# Patient Record
Sex: Female | Born: 1990 | Race: Black or African American | Hispanic: No | Marital: Single | Smoking: Former smoker
Health system: Southern US, Community
[De-identification: ages and names within clinical notes are randomized; demographics above are authoritative.]

## PROBLEM LIST (undated history)

## (undated) DIAGNOSIS — R000 Tachycardia, unspecified: Secondary | ICD-10-CM

## (undated) DIAGNOSIS — F409 Phobic anxiety disorder, unspecified: Secondary | ICD-10-CM

## (undated) DIAGNOSIS — F419 Anxiety disorder, unspecified: Secondary | ICD-10-CM

## (undated) HISTORY — DX: Anxiety disorder, unspecified: F41.9

## (undated) HISTORY — DX: Tachycardia, unspecified: R00.0

## (undated) HISTORY — DX: Insomnia due to other mental disorder: F40.9

## (undated) HISTORY — PX: WISDOM TOOTH EXTRACTION: SHX21

---
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
## 2010-10-02 IMAGING — US US RENAL KIDNEY
1 series · 17 of 25 positions shown · non-contrast
Comparison: none

REASON FOR EXAM: UTI
COMMENTS:

[Series 1: us renal kidney · 17 of 39 slices shown]
[im 1/39]
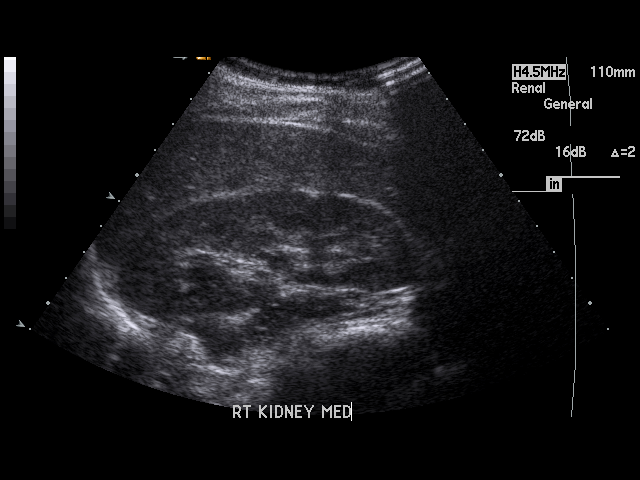
[im 4/39]
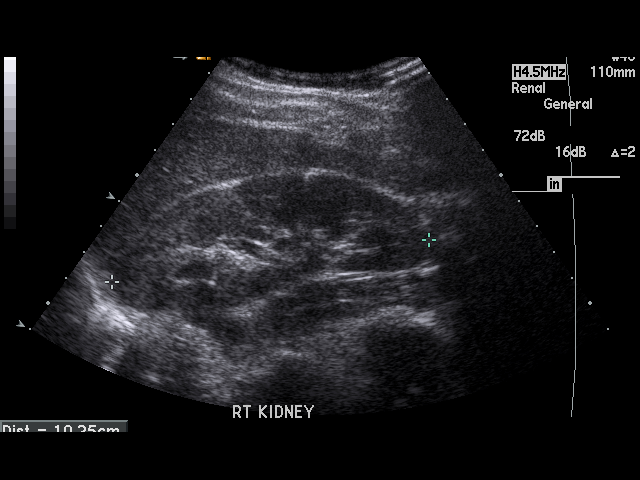
[im 5/39]
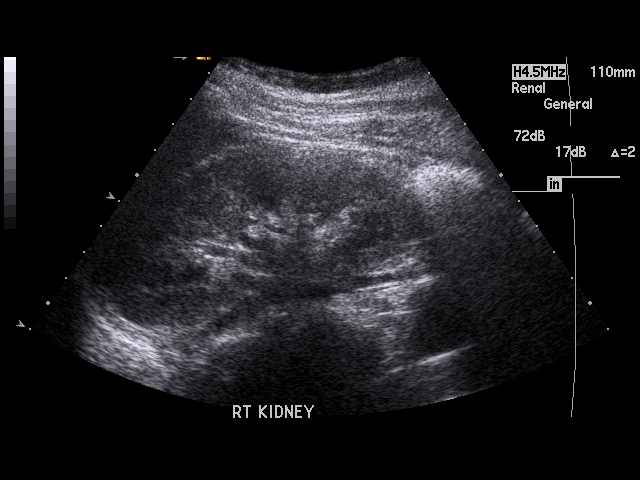
[im 8/39]
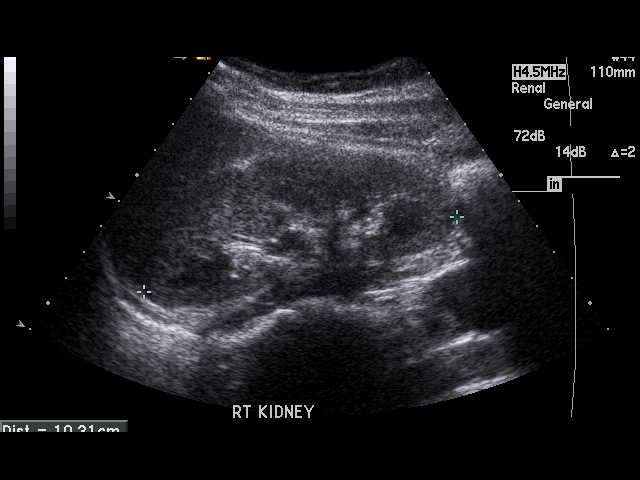
[im 10/39]
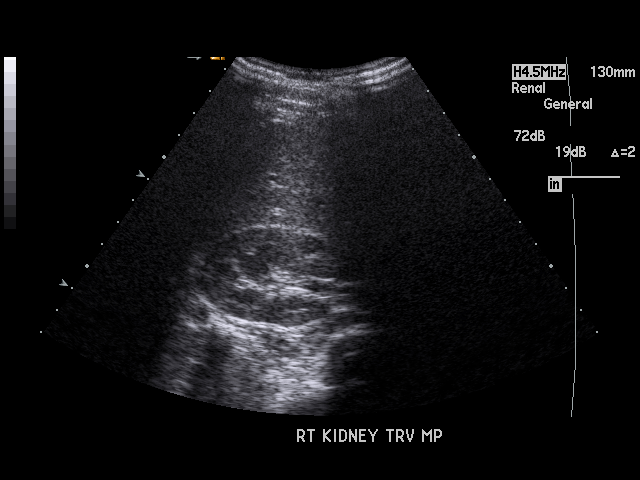
[im 13/39]
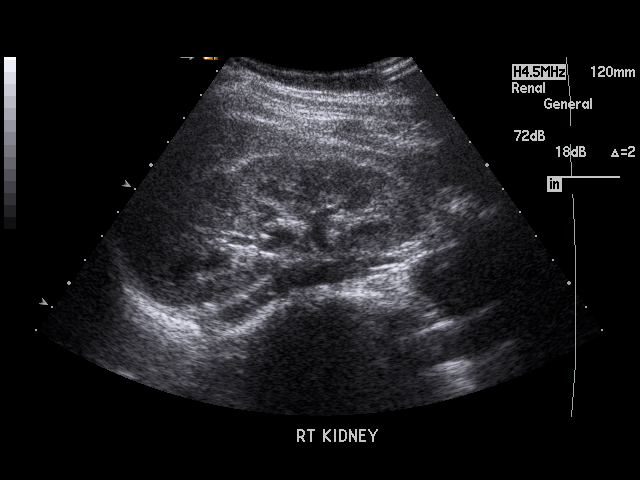
[im 15/39]
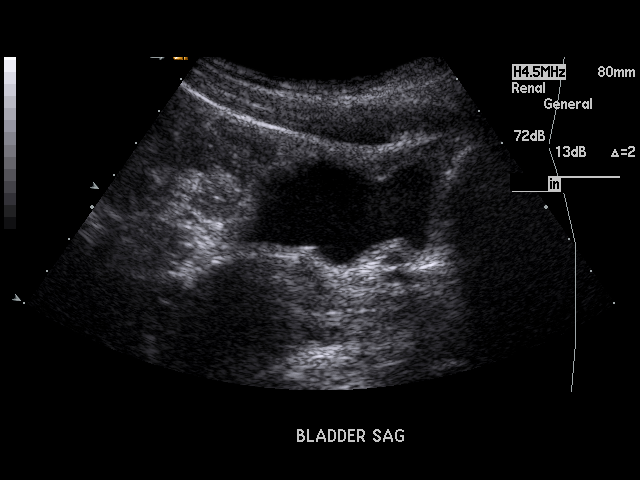
[im 18/39]
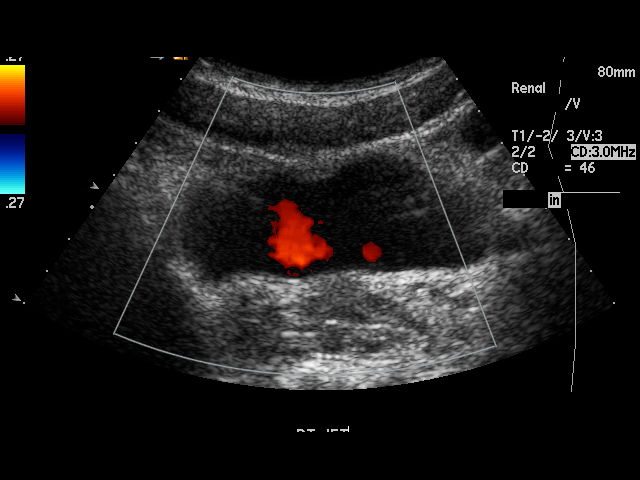
[im 20/39]
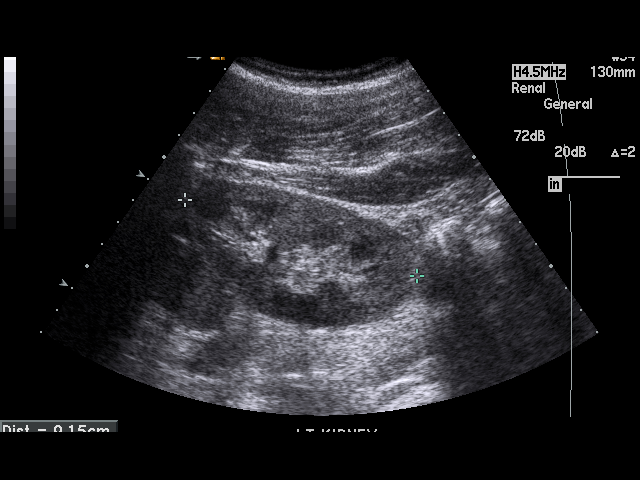
[im 21/39]
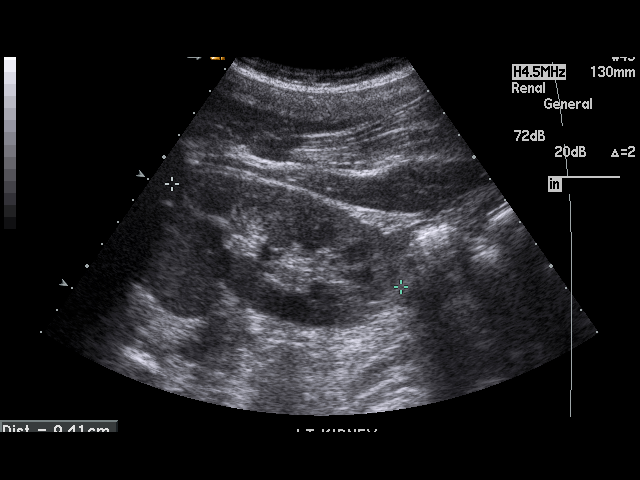
[im 24/39]
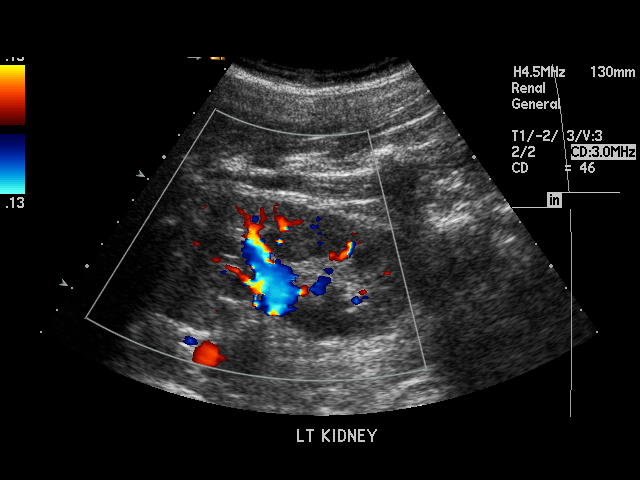
[im 26/39]
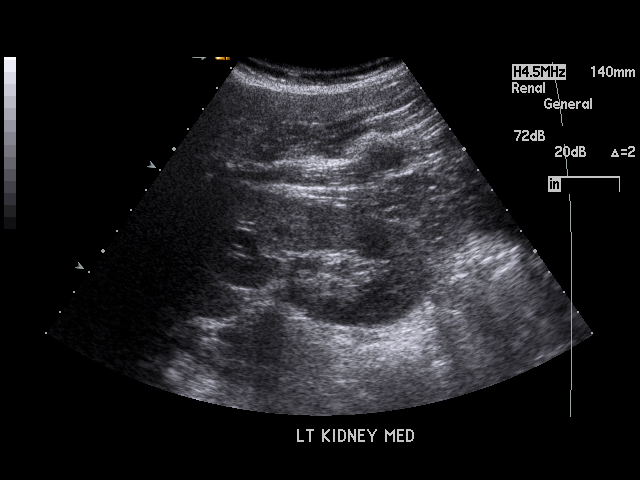
[im 29/39]
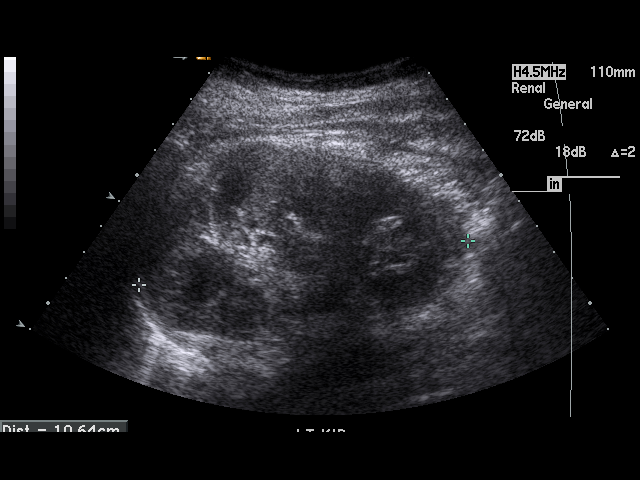
[im 31/39]
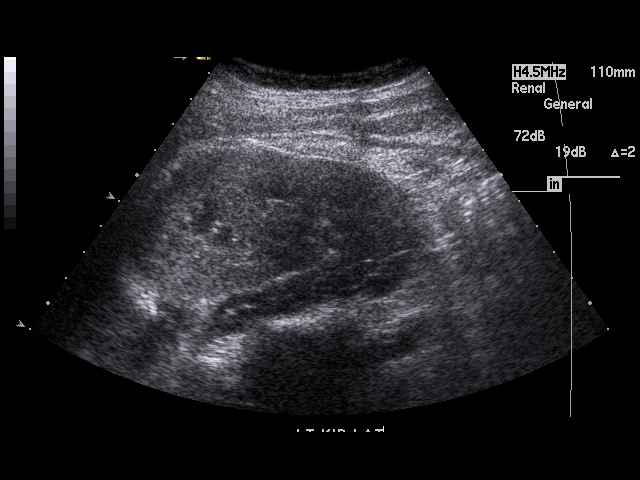
[im 34/39]
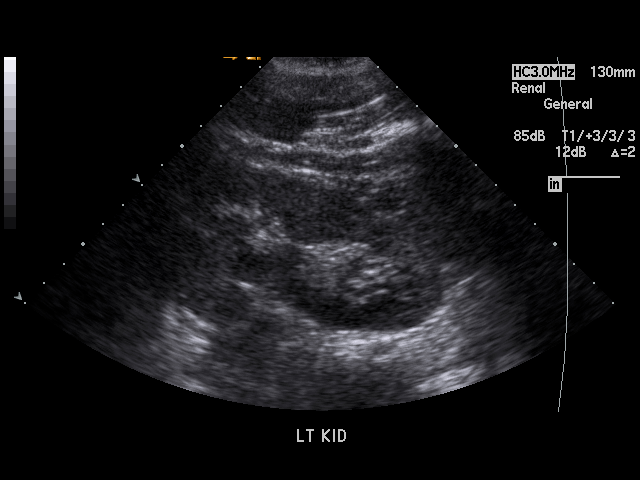
[im 35/39]
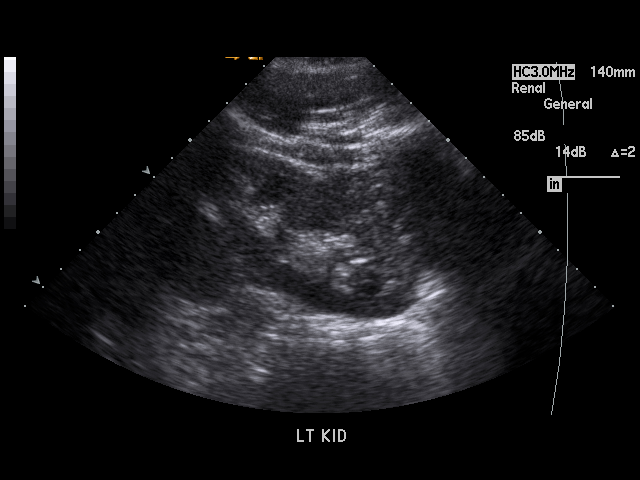
[im 39/39]
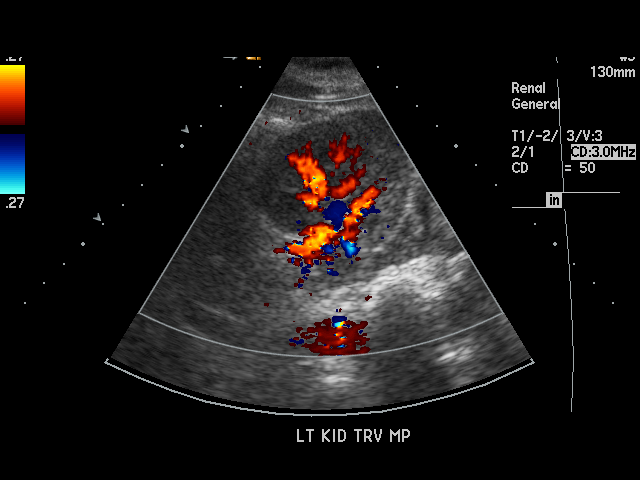

[17 of 25 positions shown; findings below may reference images not displayed]

PROCEDURE:     US  - US KIDNEY  - July 17, 2009  [DATE]

RESULT:     The right kidney measures 10.31 cm x 5.25 cm 3.75 cm and the
left kidney measures 10.64 cm x 5.18 cm x 5.41 cm. The renal cortical
margins are smooth. No renal mass lesions are seen. No renal calcifications
are identified. There is no hydronephrosis. The visualized portion of the
urinary bladder shows no significant abnormalities. Bilateral ureteral flow
jets are seen.
IMPRESSION: 1.     No significant abnormalities are noted.

## 2016-07-04 DIAGNOSIS — N939 Abnormal uterine and vaginal bleeding, unspecified: Secondary | ICD-10-CM | POA: Insufficient documentation

## 2020-06-05 ENCOUNTER — Encounter: Payer: Self-pay | Admitting: Physician Assistant

## 2020-06-05 ENCOUNTER — Other Ambulatory Visit: Payer: Self-pay

## 2020-06-05 ENCOUNTER — Ambulatory Visit: Payer: BC Managed Care – PPO | Admitting: Physician Assistant

## 2020-06-05 DIAGNOSIS — R202 Paresthesia of skin: Secondary | ICD-10-CM

## 2020-06-05 DIAGNOSIS — F320 Major depressive disorder, single episode, mild: Secondary | ICD-10-CM | POA: Diagnosis not present

## 2020-06-05 DIAGNOSIS — R5383 Other fatigue: Secondary | ICD-10-CM

## 2020-06-05 DIAGNOSIS — I071 Rheumatic tricuspid insufficiency: Secondary | ICD-10-CM

## 2020-06-05 DIAGNOSIS — R000 Tachycardia, unspecified: Secondary | ICD-10-CM

## 2020-06-05 DIAGNOSIS — Z7689 Persons encountering health services in other specified circumstances: Secondary | ICD-10-CM | POA: Diagnosis not present

## 2020-06-05 DIAGNOSIS — R200 Anesthesia of skin: Secondary | ICD-10-CM | POA: Diagnosis not present

## 2020-06-05 DIAGNOSIS — F411 Generalized anxiety disorder: Secondary | ICD-10-CM

## 2020-06-05 NOTE — Progress Notes (Signed)
[facility] [address] [place], [place] [address]  Internal MEDICINE  Office Visit Note  Patient Name: Nina Wright  [id number]  [id number]  Date of Service: 06/05/2020   Complaints/HPI Pt is here for establishment of PCP. Chief Complaint  Patient presents with  . New Patient (Initial Visit)  . Nutrition Counseling    Pt recently started as a vegetarian and at times he feel he could be lacking something like B12, he may lean on her arm for about 2 mons and it will go numb  . Quality Metric Gaps    Hep C screen, HIV screen, Tdap, pap smear (2019 was normal)   HPI Pt is here to establish care. Moved back from [place] a few years ago. Mom is a patient here and referred her. -Became vegetarian for 5 months now and has been having numbness in extremities recently. Lost 25 lbs in this timeframe and is happy about that, but worried she has nutritional deficits because of diet change. -Feels very tired the past 2 months, sleeping more. She does snore, does not wake gasping or stop breathing. Naps every lunch break though. May need to consider PSG pending labs.  -Hx of tachycardia, found small leak in tricuspid back in HS, hx of blacking out in sports. It was seen as mild so no need for any intervention. Recommended to stop strenuous activity at the time. Per patient nothing else was abnormal apart from TR. Does note her HR is still fast at times, but has not been on any medications in awhile, and reports no fluttering sensation, SOB, CP, or dizziness. 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 Does have anxiety sometimes with stress from work. Tries breathing exercises. Saw a therapist for depression around 2019. Stopped bc she moved back up here from [place]. Pt wants referral for psych to get established with new counselor. Does not want to start medication at this point, but would rather restart counseling for now.   Current Medication: Outpatient Encounter  Medications as of 06/05/2020  Medication Sig  . Multiple Vitamins-Minerals (MULTIVITAMIN WOMEN PO) Take by mouth.   No facility-administered encounter medications on file as of 06/05/2020.    Surgical History: Past Surgical History:  Procedure Laterality Date  . WISDOM TOOTH EXTRACTION     removed only bottom    Medical History: Past Medical History:  Diagnosis Date  . Tachycardia    leaky tricuspid valve pt was in high school    Family History: Family History  Problem Relation Age of Onset  . Diabetes Mother     Social History   Socioeconomic History  . Marital status: Single    Spouse name: Not on file  . Number of children: Not on file  . Years of education: Not on file  . Highest education level: Not on file  Occupational History  . Not on file  Tobacco Use  . Smoking status: Former Smoker    Types: Cigarettes  . Smokeless tobacco: Never Used  . Tobacco comment: age 30  Substance and Sexual Activity  . Alcohol use: Yes    Alcohol/week: 2.0 standard drinks    Types: 2 Cans of beer per week    Comment: once a week  . Drug use: Never  . Sexual activity: Not on file  Other Topics Concern  . Not on file  Social History Narrative  . Not on file   Social Determinants of Health   Financial Resource Strain: Not  on file  Food Insecurity: Not on file  Transportation Needs: Not on file  Physical Activity: Not on file  Stress: Not on file  Social Connections: Not on file  Intimate Partner Violence: Not on file     Review of Systems  Constitutional: Positive for fatigue. Negative for chills and unexpected weight change.  HENT: Negative for congestion, postnasal drip, rhinorrhea, sneezing and sore throat.   Eyes: Negative for redness.  Respiratory: Negative for cough, chest tightness and shortness of breath.   Cardiovascular: Negative for chest pain, palpitations and leg swelling.       Hx tachycardia, denies feeling any irregular beating/fluttering   Gastrointestinal: Negative for abdominal pain, constipation, diarrhea, nausea and vomiting.  Genitourinary: Negative for dysuria and frequency.  Musculoskeletal: Negative for arthralgias, back pain, joint swelling and neck pain.  Skin: Negative for rash.  Neurological: Positive for numbness. Negative for dizziness, tremors, syncope and headaches.  Hematological: Negative for adenopathy. Does not bruise/bleed easily.  Psychiatric/Behavioral: Positive for dysphoric mood. Negative for behavioral problems (Depression), sleep disturbance and suicidal ideas. The patient is nervous/anxious.     Vital Signs: BP 112/78   Pulse (!) 104   Temp 98.7 F (37.1 C)   Resp 16   Ht 5\' 4"  (1.626 m)   Wt 183 lb 9.6 oz (83.3 kg)   SpO2 99%   BMI 31.51 kg/m    Physical Exam Vitals and nursing note reviewed.  Constitutional:      General: She is not in acute distress.    Appearance: She is well-developed. She is obese. She is not diaphoretic.  HENT:     Head: Normocephalic and atraumatic.     Mouth/Throat:     Pharynx: No oropharyngeal exudate.  Eyes:     Pupils: Pupils are equal, round, and reactive to light.  Neck:     Thyroid: No thyromegaly.     Vascular: No JVD.     Trachea: No tracheal deviation.  Cardiovascular:     Rate and Rhythm: Regular rhythm. Tachycardia present.     Heart sounds: Normal heart sounds. No murmur heard. No friction rub. No gallop.   Pulmonary:     Effort: Pulmonary effort is normal. No respiratory distress.     Breath sounds: No wheezing or rales.  Chest:     Chest wall: No tenderness.  Abdominal:     General: Bowel sounds are normal.     Palpations: Abdomen is soft.  Musculoskeletal:        General: Normal range of motion.     Cervical back: Normal range of motion and neck supple.     Right lower leg: No edema.     Left lower leg: No edema.  Lymphadenopathy:     Cervical: No cervical adenopathy.  Skin:    General: Skin is warm and dry.  Neurological:      Mental Status: She is alert and oriented to person, place, and time.     Cranial Nerves: No cranial nerve deficit.     Sensory: No sensory deficit.  Psychiatric:        Behavior: Behavior normal.        Thought Content: Thought content normal.        Judgment: Judgment normal.       Assessment/Plan: 1. Encounter to establish care with new doctor Will obtain routine fasting labs to be done before CPE w/ pap next visit  2. Numbness and tingling of left upper and lower extremity Occasional numbness and  tingling in different extremities since changing diet, will obtain B12 level  3. Tachycardia Tachycardia on intake, but improved somewhat on exam. Pt does have hx of tachycardia and TR when in HS so will obtain new echo for evaluation since pt is tired all the time and would like to be able to exercise more and was told not to do strenuous activity when first dx. Pt will call if she becomes symptomatic--may need EKG or holter/zio. May also need to consider BB. - ECHOCARDIOGRAM COMPLETE  4. Tricuspid valve insufficiency, unspecified etiology Pt does have hx of tachycardia and TR when in HS so will obtain new echo for evaluation since pt is tired all the time and would like to be able to exercise more and was told not to do strenuous activity when first dx. - ECHOCARDIOGRAM COMPLETE  5. GAD (generalized anxiety disorder) Pt not interested in starting medication at this point, but would like to get established with psych in order to find new counselor in this area. Previously saw therapist in [place]. - Ambulatory referral to Psychiatry  6. Depression, major, single episode, mild (HCC) Pt not interested in starting medication at this point, but would like to get established with psych in order to find new counselor in this area. Previously saw therapist in [place]. - Ambulatory referral to Psychiatry  7. Other fatigue - CBC With Differential - Comprehensive metabolic panel -  TSH + free T4 - B12 and Folate Panel - Lipid Panel With LDL/HDL Ratio - Iron, TIBC and Ferritin Panel - VITAMIN D 25 Hydroxy (Vit-D Deficiency, Fractures)  General Counseling: Nina Wright verbalizes understanding of the findings of todays visit and agrees with plan of treatment. I have discussed any further diagnostic evaluation that may be needed or ordered today. We also reviewed her medications today. she has been encouraged to call the office with any questions or concerns that should arise related to todays visit.    Counseling:    Orders Placed This Encounter  Procedures  . CBC With Differential  . Comprehensive metabolic panel  . TSH + free T4  . B12 and Folate Panel  . Lipid Panel With LDL/HDL Ratio  . Iron, TIBC and Ferritin Panel  . VITAMIN D 25 Hydroxy (Vit-D Deficiency, Fractures)  . Ambulatory referral to Psychiatry  . ECHOCARDIOGRAM COMPLETE    No orders of the defined types were placed in this encounter.    This patient was seen by Lynn Ito, PA-C in collaboration with Dr. Beverely Risen as a part of collaborative care agreement.   Time spent:40 Minutes

## 2020-06-11 LAB — CBC WITH DIFFERENTIAL
Basophils Absolute: 0 10*3/uL (ref 0.0–0.2)
Basos: 1 %
EOS (ABSOLUTE): 0 10*3/uL (ref 0.0–0.4)
Eos: 1 %
Hematocrit: 36 % (ref 34.0–46.6)
Hemoglobin: 11.8 g/dL (ref 11.1–15.9)
Immature Grans (Abs): 0 10*3/uL (ref 0.0–0.1)
Immature Granulocytes: 0 %
Lymphocytes Absolute: 1.7 10*3/uL (ref 0.7–3.1)
Lymphs: 33 %
MCH: 29.5 pg (ref 26.6–33.0)
MCHC: 32.8 g/dL (ref 31.5–35.7)
MCV: 90 fL (ref 79–97)
Monocytes Absolute: 0.3 10*3/uL (ref 0.1–0.9)
Monocytes: 6 %
Neutrophils Absolute: 3.1 10*3/uL (ref 1.4–7.0)
Neutrophils: 59 %
RBC: 4 x10E6/uL (ref 3.77–5.28)
RDW: 12.2 % (ref 11.7–15.4)
WBC: 5.2 10*3/uL (ref 3.4–10.8)

## 2020-06-11 LAB — COMPREHENSIVE METABOLIC PANEL
ALT: 12 IU/L (ref 0–32)
AST: 16 IU/L (ref 0–40)
Albumin/Globulin Ratio: 2 (ref 1.2–2.2)
Albumin: 4.4 g/dL (ref 3.9–5.0)
Alkaline Phosphatase: 57 IU/L (ref 44–121)
BUN/Creatinine Ratio: 10 (ref 9–23)
BUN: 7 mg/dL (ref 6–20)
Bilirubin Total: 0.3 mg/dL (ref 0.0–1.2)
CO2: 21 mmol/L (ref 20–29)
Calcium: 9.2 mg/dL (ref 8.7–10.2)
Chloride: 103 mmol/L (ref 96–106)
Creatinine, Ser: 0.68 mg/dL (ref 0.57–1.00)
Globulin, Total: 2.2 g/dL (ref 1.5–4.5)
Glucose: 85 mg/dL (ref 65–99)
Potassium: 4.4 mmol/L (ref 3.5–5.2)
Sodium: 138 mmol/L (ref 134–144)
Total Protein: 6.6 g/dL (ref 6.0–8.5)
eGFR: 121 mL/min/{1.73_m2} (ref >59)

## 2020-06-11 LAB — TSH+FREE T4
Free T4: 0.96 ng/dL (ref 0.82–1.77)
TSH: 0.439 u[IU]/mL — ABNORMAL LOW (ref 0.450–4.500)

## 2020-06-11 LAB — IRON,TIBC AND FERRITIN PANEL
Ferritin: 16 ng/mL (ref 15–150)
Iron Saturation: 24 % (ref 15–55)
Iron: 80 ug/dL (ref 27–159)
Total Iron Binding Capacity: 335 ug/dL (ref 250–450)
UIBC: 255 ug/dL (ref 131–425)

## 2020-06-11 LAB — LIPID PANEL WITH LDL/HDL RATIO
Cholesterol, Total: 186 mg/dL (ref 100–199)
HDL: 59 mg/dL (ref >39)
LDL Chol Calc (NIH): 114 mg/dL — ABNORMAL HIGH (ref 0–99)
LDL/HDL Ratio: 1.9 ratio (ref 0.0–3.2)
Triglycerides: 69 mg/dL (ref 0–149)
VLDL Cholesterol Cal: 13 mg/dL (ref 5–40)

## 2020-06-11 LAB — B12 AND FOLATE PANEL
Folate: 15 ng/mL (ref >3.0)
Vitamin B-12: 481 pg/mL (ref 232–1245)

## 2020-06-11 LAB — VITAMIN D 25 HYDROXY (VIT D DEFICIENCY, FRACTURES): Vit D, 25-Hydroxy: 6.6 ng/mL — ABNORMAL LOW (ref 30.0–100.0)

## 2020-06-22 ENCOUNTER — Other Ambulatory Visit: Payer: Self-pay | Admitting: Physician Assistant

## 2020-06-22 DIAGNOSIS — F320 Major depressive disorder, single episode, mild: Secondary | ICD-10-CM

## 2020-06-22 DIAGNOSIS — F411 Generalized anxiety disorder: Secondary | ICD-10-CM

## 2020-06-22 MED ORDER — HYDROXYZINE PAMOATE 25 MG PO CAPS: 25.0000 mg | ORAL_CAPSULE | Freq: Every day | ORAL | 1 refills | Status: DC

## 2020-06-22 MED ORDER — ESCITALOPRAM OXALATE 10 MG PO TABS: 10.0000 mg | ORAL_TABLET | Freq: Every day | ORAL | 2 refills | Status: DC

## 2020-06-25 ENCOUNTER — Ambulatory Visit: Payer: BC Managed Care – PPO | Admitting: Physician Assistant

## 2020-06-25 ENCOUNTER — Encounter: Payer: Self-pay | Admitting: Physician Assistant

## 2020-06-25 ENCOUNTER — Other Ambulatory Visit: Payer: Self-pay

## 2020-06-25 VITALS — BP 122/79 | HR 74 | Temp 97.8°F | Resp 16 | Ht 64.0 in | Wt 183.0 lb

## 2020-06-25 DIAGNOSIS — F411 Generalized anxiety disorder: Secondary | ICD-10-CM | POA: Diagnosis not present

## 2020-06-25 DIAGNOSIS — Z0271 Encounter for disability determination: Secondary | ICD-10-CM

## 2020-06-25 DIAGNOSIS — N939 Abnormal uterine and vaginal bleeding, unspecified: Secondary | ICD-10-CM | POA: Diagnosis not present

## 2020-06-25 DIAGNOSIS — R7989 Other specified abnormal findings of blood chemistry: Secondary | ICD-10-CM

## 2020-06-25 DIAGNOSIS — F322 Major depressive disorder, single episode, severe without psychotic features: Secondary | ICD-10-CM | POA: Diagnosis not present

## 2020-06-25 DIAGNOSIS — E559 Vitamin D deficiency, unspecified: Secondary | ICD-10-CM

## 2020-06-25 MED ORDER — ERGOCALCIFEROL 1.25 MG (50000 UT) PO CAPS: ORAL_CAPSULE | ORAL | 3 refills | Status: DC

## 2020-06-25 NOTE — Progress Notes (Signed)
[facility] [address] [place], [place] [address]  Internal MEDICINE  Office Visit Note  Patient Name: Nina Wright  [id number]  [id number]  Date of Service: 06/26/2020  Chief Complaint  Patient presents with  . Follow-up    Irregular spotting   . insomnia due to anxiety    HPI  PT is here for f/u.  -Starting have spotting for 12 days. Did have a clot pass. Her normal cycle is usually only 3 days and is heavier and pretty regular and is supposed to start in the next few days. Hx of AUB seen by OBGYN and US done in 2018 showing signs of PCOS . Not interested in birth control pill then or again at this time. Thinks this just due to stress -Psychiatrist contacted and should be 1-2 month wait. Previous counselor in [place] available via zoom this coming Monday. Interested in a sooner appt. Feels that this is the worst her anxiety and depression has been. Has previously hurt herself, but doesn't feel this way now. Hard to motivate herself much less others at work. Feels that if someone were to think of harming her she would let them. Never hospitalized. Has seeked a lot of therapy. No thoughts or plans of killing herself or harming others. Does have firearms in the home for self defense but states she would not try to harm herself or anyone with them. Denies suicidal ideation or needing any inpatient care at this time. -Hydroxyzine is helping some at night, took it at 8:30-9 and went to bed around 12. Felt a little groggy the next morning so suggest taking earlier. Taken lexapro 1 time since called in a few days ago. She will start taking daily. She is a [place] and states that she would not harm herself as this would "be a sin leading to hell." -Also discussed she had very low vitamin D on recent labs and would like to start on supplement as this may help improve fatigue and maybe her mood some.  Current Medication: Outpatient Encounter Medications as of 06/25/2020   Medication Sig  . ergocalciferol (DRISDOL) 1.25 MG (50000 UT) capsule Take one cap q week  . escitalopram (LEXAPRO) 10 MG tablet Take 1 tablet (10 mg total) by mouth daily.  . hydrOXYzine (VISTARIL) 25 MG capsule Take 1 capsule (25 mg total) by mouth at bedtime.  . Multiple Vitamins-Minerals (MULTIVITAMIN WOMEN PO) Take by mouth.   No facility-administered encounter medications on file as of 06/25/2020.    Surgical History: Past Surgical History:  Procedure Laterality Date  . WISDOM TOOTH EXTRACTION     removed only bottom    Medical History: Past Medical History:  Diagnosis Date  . Insomnia due to anxiety and fear   . Tachycardia    leaky tricuspid valve pt was in high school    Family History: Family History  Problem Relation Age of Onset  . Diabetes Mother     Social History   Socioeconomic History  . Marital status: Single    Spouse name: Not on file  . Number of children: Not on file  . Years of education: Not on file  . Highest education level: Not on file  Occupational History  . Not on file  Tobacco Use  . Smoking status: Former Smoker    Types: Cigarettes  . Smokeless tobacco: Never Used  . Tobacco comment: age 12  Substance and Sexual Activity  . Alcohol use: Yes    Alcohol/week: 2.0 standard drinks  Types: 2 Cans of beer per week    Comment: once a week  . Drug use: Never  . Sexual activity: Not on file  Other Topics Concern  . Not on file  Social History Narrative  . Not on file   Social Determinants of Health   Financial Resource Strain: Not on file  Food Insecurity: Not on file  Transportation Needs: Not on file  Physical Activity: Not on file  Stress: Not on file  Social Connections: Not on file  Intimate Partner Violence: Not on file      Review of Systems  Constitutional: Positive for fatigue. Negative for chills and unexpected weight change.  HENT: Negative for congestion, postnasal drip, rhinorrhea, sneezing and sore  throat.   Eyes: Negative for redness.  Respiratory: Negative for cough, chest tightness and shortness of breath.   Cardiovascular: Negative for chest pain and palpitations.  Gastrointestinal: Negative for abdominal pain, constipation, diarrhea, nausea and vomiting.  Genitourinary: Negative for dysuria and frequency.  Musculoskeletal: Negative for arthralgias, back pain, joint swelling and neck pain.  Skin: Negative for rash.  Neurological: Negative.  Negative for tremors and numbness.  Hematological: Negative for adenopathy. Does not bruise/bleed easily.  Psychiatric/Behavioral: Positive for behavioral problems (Depression) and sleep disturbance. Negative for self-injury and suicidal ideas. The patient is nervous/anxious.     Vital Signs: BP 122/79   Pulse 74   Temp 97.8 F (36.6 C)   Resp 16   Ht 5\' 4"  (1.626 m)   Wt 183 lb (83 kg)   SpO2 99%   BMI 31.41 kg/m    Physical Exam Vitals and nursing note reviewed.  Constitutional:      General: She is not in acute distress.    Appearance: She is well-developed. She is obese. She is not diaphoretic.  HENT:     Head: Normocephalic and atraumatic.     Mouth/Throat:     Pharynx: No oropharyngeal exudate.  Eyes:     Pupils: Pupils are equal, round, and reactive to light.  Neck:     Thyroid: No thyromegaly.     Vascular: No JVD.     Trachea: No tracheal deviation.  Cardiovascular:     Rate and Rhythm: Normal rate and regular rhythm.     Heart sounds: Normal heart sounds. No murmur heard. No friction rub. No gallop.   Pulmonary:     Effort: Pulmonary effort is normal. No respiratory distress.     Breath sounds: No wheezing or rales.  Chest:     Chest wall: No tenderness.  Abdominal:     General: Bowel sounds are normal.     Palpations: Abdomen is soft.  Musculoskeletal:        General: Normal range of motion.     Cervical back: Normal range of motion and neck supple.  Lymphadenopathy:     Cervical: No cervical  adenopathy.  Skin:    General: Skin is warm and dry.  Neurological:     Mental Status: She is alert and oriented to person, place, and time.     Cranial Nerves: No cranial nerve deficit.  Psychiatric:        Thought Content: Thought content normal.        Judgment: Judgment normal.     Comments: Pt is very distressed and anxious in room        Assessment/Plan: 1. GAD (generalized anxiety disorder) Continue hydroxyzine to help with anxiety and sleep at night. Patient will go to walk-in psych[facility]crisis  center at [facility] for immediate counseling rather than wait until Monday. Educated to go to the ED if any worsening or thoughts of harming herself or others.  2. Depression, major, single episode, severe (HCC) Continue Lexapro daily. Patient will go to walk-in psych/crisis center at [facility] for immediate counseling rather than wait until Monday. Educated to go to the ED if any worsening or thoughts of harming herself or others.  3. Abnormal uterine bleeding Hx of PCOS found on [place] in 2018 by OBGYN for AUB at that time. Not interested in starting a birth control pill to help with this. Wants to see if stress is cause since this has happened before and resolved on its own. Will monitor.  4. Vitamin D deficiency Start weekly supplement - ergocalciferol (DRISDOL) 1.25 MG (50000 UT) capsule; Take one cap q week  Dispense: 12 capsule; Refill: 3  5. Encounter for disability assessment Paperwork will be filled out. Patient needs to be out from work at the present time in order to seek appropriate therapy and counseling for her mental health and allow medications to take effect.  6. Low TSH level Borderline low TSH with normal free T4. Will monitor closely and consider thyroid US in the future    General Counseling: Katilynn verbalizes understanding of the findings of todays visit and agrees with plan of treatment. I have discussed any further diagnostic evaluation that may be needed or ordered today. We  also reviewed her medications today. she has been encouraged to call the office with any questions or concerns that should arise related to todays visit.    No orders of the defined types were placed in this encounter.   Meds ordered this encounter  Medications  . ergocalciferol (DRISDOL) 1.25 MG (50000 UT) capsule    Sig: Take one cap q week    Dispense:  12 capsule    Refill:  3    This patient was seen by Lynn Ito, PA-C in collaboration with Dr. Beverely Risen as a part of collaborative care agreement.   Total time spent:40 Minutes Time spent includes review of chart, medications, test results, and follow up plan with the patient.      Dr Lyndon Code Internal medicine

## 2020-06-26 ENCOUNTER — Telehealth: Payer: Self-pay

## 2020-06-26 NOTE — Telephone Encounter (Signed)
Patient requesting other #s for psychiatry. I explained to her since there are so many showing for her [employer], she can go onto [email or web address] to find provider.

## 2020-07-01 ENCOUNTER — Telehealth: Payer: Self-pay

## 2020-07-01 NOTE — Telephone Encounter (Signed)
Paperwork for temp disability from work due to depression and anxiety was completed by Nina Wright and faxed to pt's work,  Pt also came by office and picked up paperwork to take to work.  We made copy of paperwork and placed in scan and pt took original copy

## 2020-07-13 ENCOUNTER — Telehealth: Payer: Self-pay

## 2020-07-13 NOTE — Telephone Encounter (Signed)
Pt called that her therapist want her to stay 2 more weeks off from work as per dr Welton Flakes its ok to drop form and also advised that we need progress note from work

## 2020-07-17 ENCOUNTER — Encounter: Payer: Self-pay | Admitting: Physician Assistant

## 2020-07-17 ENCOUNTER — Other Ambulatory Visit: Payer: Self-pay

## 2020-07-17 ENCOUNTER — Ambulatory Visit (INDEPENDENT_AMBULATORY_CARE_PROVIDER_SITE_OTHER): Payer: BC Managed Care – PPO | Admitting: Physician Assistant

## 2020-07-17 DIAGNOSIS — F322 Major depressive disorder, single episode, severe without psychotic features: Secondary | ICD-10-CM

## 2020-07-17 DIAGNOSIS — F411 Generalized anxiety disorder: Secondary | ICD-10-CM

## 2020-07-17 DIAGNOSIS — R300 Dysuria: Secondary | ICD-10-CM | POA: Diagnosis not present

## 2020-07-17 DIAGNOSIS — Z124 Encounter for screening for malignant neoplasm of cervix: Secondary | ICD-10-CM

## 2020-07-17 DIAGNOSIS — Z01419 Encounter for gynecological examination (general) (routine) without abnormal findings: Secondary | ICD-10-CM | POA: Diagnosis not present

## 2020-07-17 DIAGNOSIS — R7989 Other specified abnormal findings of blood chemistry: Secondary | ICD-10-CM

## 2020-07-17 DIAGNOSIS — Z113 Encounter for screening for infections with a predominantly sexual mode of transmission: Secondary | ICD-10-CM | POA: Diagnosis not present

## 2020-07-17 DIAGNOSIS — E559 Vitamin D deficiency, unspecified: Secondary | ICD-10-CM | POA: Diagnosis not present

## 2020-07-17 DIAGNOSIS — Z0001 Encounter for general adult medical examination with abnormal findings: Secondary | ICD-10-CM | POA: Diagnosis not present

## 2020-07-17 DIAGNOSIS — I071 Rheumatic tricuspid insufficiency: Secondary | ICD-10-CM | POA: Diagnosis not present

## 2020-07-17 NOTE — Progress Notes (Signed)
[facility] [place] [address], [place] [address]  Internal MEDICINE  Office Visit Note  Patient Name: Nina Wright  [id number]  [id number]  Date of Service: 07/17/2020  Chief Complaint  Patient presents with  . Annual Exam    Discuss paperwork     HPI Pt is here for routine health maintenance examination -[name] counselor met with her over video. Seeing her at least once per week. Not established with psych here yet because has not heard from any of them yet or received new paperwork. Will try calling to see if paperwork has been sent yet. Tried going back to [facility] walk in but always busy, will try going sooner if needed. She is still feeling depressed and anxious. She has had a reduced appetite and is not sleeping very well still. She is not feeling like herself and needs more time for the medications to take full effect. She is taking hydroxyzine at night and lexapro during the day. Will have her increase lexapro to 67m in the next week to see if greater benefit. Will extend disability to give her more time for the medications to really take effect and for more time with counseling. Will have disability extended until may 28th in accordance with therapist's recommendations as well. -Last pap 2019, last cycle around the 4/25 and lasts about 4 days. Spotting a little. -HR is improved today. 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 Pt will wait to have this scheduled until anxiety and depression stabilized and feeling more like herself. -Labs were reviewed last visit and she was started on a vit D supplement. Also discussed that her TSh was a little low but free T4 normal--will monitor closely and consider thyroid [place]  Current Medication: Outpatient Encounter Medications as of 07/17/2020  Medication Sig  . ergocalciferol (DRISDOL) 1.25 MG (50000 UT) capsule Take one cap q week  . escitalopram (LEXAPRO) 10 MG  tablet Take 1 tablet (10 mg total) by mouth daily.  . hydrOXYzine (VISTARIL) 25 MG capsule Take 1 capsule (25 mg total) by mouth at bedtime.  . Multiple Vitamins-Minerals (MULTIVITAMIN WOMEN PO) Take by mouth.   No facility-administered encounter medications on file as of 07/17/2020.    Surgical History: Past Surgical History:  Procedure Laterality Date  . WISDOM TOOTH EXTRACTION     removed only bottom    Medical History: Past Medical History:  Diagnosis Date  . Insomnia due to anxiety and fear   . Tachycardia    leaky tricuspid valve pt was in high school    Family History: Family History  Problem Relation Age of Onset  . Diabetes Mother       Review of Systems  Constitutional: Positive for appetite change and fatigue. Negative for chills and unexpected weight change.  HENT: Negative for congestion, postnasal drip, rhinorrhea, sneezing and sore throat.   Eyes: Negative for redness.  Respiratory: Negative for cough, chest tightness and shortness of breath.   Cardiovascular: Negative for chest pain and palpitations.  Gastrointestinal: Negative for abdominal pain, constipation, diarrhea, nausea and vomiting.  Genitourinary: Negative for dysuria and frequency.  Musculoskeletal: Negative for arthralgias, back pain, joint swelling and neck pain.  Skin: Negative for rash.  Neurological: Negative.  Negative for tremors and numbness.  Hematological: Negative for adenopathy. Does not bruise/bleed easily.  Psychiatric/Behavioral: Positive for behavioral problems (Depression) and sleep disturbance. Negative for suicidal ideas. The patient is nervous/anxious.  Vital Signs: BP 118/80   Pulse 70   Temp 97.7 F (36.5 C)   Resp 16   Ht _0  (1.626 m)   Wt 185 lb 9.6 oz (84.2 kg)   SpO2 99%   BMI 31.86 kg/m    Physical Exam Vitals and nursing note reviewed. Exam conducted with a chaperone present.  Constitutional:      General: She is not in acute distress.     Appearance: She is well-developed. She is obese. She is not diaphoretic.  HENT:     Head: Normocephalic and atraumatic.     Right Ear: External ear normal.     Left Ear: External ear normal.     Nose: Nose normal.     Mouth/Throat:     Pharynx: No oropharyngeal exudate.  Eyes:     General: No scleral icterus.       Right eye: No discharge.        Left eye: No discharge.     Conjunctiva/sclera: Conjunctivae normal.     Pupils: Pupils are equal, round, and reactive to light.  Neck:     Thyroid: No thyromegaly.     Vascular: No JVD.     Trachea: No tracheal deviation.  Cardiovascular:     Rate and Rhythm: Normal rate and regular rhythm.     Heart sounds: Normal heart sounds. No murmur heard. No friction rub. No gallop.   Pulmonary:     Effort: Pulmonary effort is normal. No respiratory distress.     Breath sounds: Normal breath sounds. No stridor. No wheezing or rales.  Chest:     Chest wall: No tenderness.  Breasts:     Right: Normal. No mass.     Left: Normal. No mass.    Abdominal:     General: Bowel sounds are normal. There is no distension.     Palpations: Abdomen is soft. There is no mass.     Tenderness: There is no abdominal tenderness. There is no guarding or rebound.  Genitourinary:    Exam position: Lithotomy position.     Cervix: Friability present.  Musculoskeletal:        General: No tenderness or deformity. Normal range of motion.     Cervical back: Normal range of motion and neck supple.  Lymphadenopathy:     Cervical: No cervical adenopathy.  Skin:    General: Skin is warm and dry.     Coloration: Skin is not pale.     Findings: No erythema or rash.  Neurological:     Mental Status: She is alert.     Cranial Nerves: No cranial nerve deficit.     Motor: No abnormal muscle tone.     Coordination: Coordination normal.     Deep Tendon Reflexes: Reflexes are normal and symmetric.  Psychiatric:        Thought Content: Thought content normal.         Judgment: Judgment normal.     Comments: Pt is anxious and depressed in room      LABS: Recent Results (from the past 2160 hour(s))  CBC With Differential     Status: None   Collection Time: 06/10/20  4:07 PM  Result Value Ref Range   WBC 5.2 3.4 - 10.8 x10E3/uL   RBC 4.00 3.77 - 5.28 x10E6/uL   Hemoglobin 11.8 11.1 - 15.9 g/dL   Hematocrit 36.0 34.0 - 46.6 %   MCV 90 79 - 97 fL   MCH 29.5 26.6 -  33.0 pg   MCHC 32.8 31.5 - 35.7 g/dL   RDW 12.2 11.7 - 15.4 %   Neutrophils 59 Not Estab. %   Lymphs 33 Not Estab. %   Monocytes 6 Not Estab. %   Eos 1 Not Estab. %   Basos 1 Not Estab. %   Neutrophils Absolute 3.1 1.4 - 7.0 x10E3/uL   Lymphocytes Absolute 1.7 0.7 - 3.1 x10E3/uL   Monocytes Absolute 0.3 0.1 - 0.9 x10E3/uL   EOS (ABSOLUTE) 0.0 0.0 - 0.4 x10E3/uL   Basophils Absolute 0.0 0.0 - 0.2 x10E3/uL   Immature Granulocytes 0 Not Estab. %   Immature Grans (Abs) 0.0 0.0 - 0.1 x10E3/uL   Hematology Comments: Note:     Comment: Verified by microscopic examination.  Comprehensive metabolic panel     Status: None   Collection Time: 06/10/20  4:07 PM  Result Value Ref Range   Glucose 85 65 - 99 mg/dL   BUN 7 6 - 20 mg/dL   Creatinine, Ser 0.68 0.57 - 1.00 mg/dL   eGFR 121 >59 mL/min/1.73   BUN/Creatinine Ratio 10 9 - 23   Sodium 138 134 - 144 mmol/L   Potassium 4.4 3.5 - 5.2 mmol/L   Chloride 103 96 - 106 mmol/L   CO2 21 20 - 29 mmol/L   Calcium 9.2 8.7 - 10.2 mg/dL   Total Protein 6.6 6.0 - 8.5 g/dL   Albumin 4.4 3.9 - 5.0 g/dL   Globulin, Total 2.2 1.5 - 4.5 g/dL   Albumin/Globulin Ratio 2.0 1.2 - 2.2   Bilirubin Total 0.3 0.0 - 1.2 mg/dL   Alkaline Phosphatase 57 44 - 121 IU/L   AST 16 0 - 40 IU/L   ALT 12 0 - 32 IU/L  TSH + free T4     Status: Abnormal   Collection Time: 06/10/20  4:07 PM  Result Value Ref Range   TSH 0.439 (L) 0.450 - 4.500 uIU/mL   Free T4 0.96 0.82 - 1.77 ng/dL  B12 and Folate Panel     Status: None   Collection Time: 06/10/20  4:07 PM   Result Value Ref Range   Vitamin B-12 481 232 - 1,245 pg/mL   Folate 15.0 >3.0 ng/mL    Comment: A serum folate concentration of less than 3.1 ng/mL is considered to represent clinical deficiency.   Lipid Panel With LDL/HDL Ratio     Status: Abnormal   Collection Time: 06/10/20  4:07 PM  Result Value Ref Range   Cholesterol, Total 186 100 - 199 mg/dL   Triglycerides 69 0 - 149 mg/dL   HDL 59 >39 mg/dL   VLDL Cholesterol Cal 13 5 - 40 mg/dL   LDL Chol Calc (NIH) 114 (H) 0 - 99 mg/dL   LDL/HDL Ratio 1.9 0.0 - 3.2 ratio    Comment:                                     LDL/HDL Ratio                                             Men  Women  1/2 Avg.Risk  1.0    1.5                                   Avg.Risk  3.6    3.2                                2X Avg.Risk  6.2    5.0                                3X Avg.Risk  8.0    6.1   Iron, TIBC and Ferritin Panel     Status: None   Collection Time: 06/10/20  4:07 PM  Result Value Ref Range   Total Iron Binding Capacity 335 250 - 450 ug/dL   UIBC 255 131 - 425 ug/dL   Iron 80 27 - 159 ug/dL   Iron Saturation 24 15 - 55 %   Ferritin 16 15 - 150 ng/mL  VITAMIN D 25 Hydroxy (Vit-D Deficiency, Fractures)     Status: Abnormal   Collection Time: 06/10/20  4:07 PM  Result Value Ref Range   Vit D, 25-Hydroxy 6.6 (L) 30.0 - 100.0 ng/mL    Comment: Vitamin D deficiency has been defined by the [place] and an Endocrine Society practice guideline as a level of serum 25-OH vitamin D less than 20 ng/mL (1,2). The Endocrine Society went on to further define vitamin D insufficiency as a level between 21 and 29 ng/mL (2). 1. IOM (Institute of Medicine). 2010. Dietary reference    intakes for calcium and D. [place]: The    [employer]. 2. Holick MF, Binkley Shelby, Bischoff-Ferrari HA, et al.    Evaluation, treatment, and prevention of vitamin D    deficiency: an Endocrine Society clinical  practice    guideline. JCEM. 2011 Jul; 96(7):1911-30.        Assessment/Plan: 1. Encounter for general adult medical examination with abnormal findings Labs reviewed, Pap completed at today's visit  2. GAD (generalized anxiety disorder) Continue Lexapro will increase to 20 mg in the next week, and continue hydroxyzine nightly. Patient will remain out of work until May 28 and follow-up with therapist.  Patient is also looking to establish with psych in the area  3. Depression, major, single episode, severe ([place]) Continue Lexapro will increase to 20 mg in the next week, patient will remain out of work until May 28 and follow up with therapist.  Patient is also looking to establish with psych in the area  4. Vitamin D deficiency Continue weekly vitamin D supplement  5. Low TSH level Low TSH but normal free T4 we will monitor very closely and consider obtaining thyroid ultrasound  6. Tricuspid valve insufficiency, unspecified etiology Will hold off on scheduling echo until anxiety and depression under better control.  Order has already been placed just needs to be scheduled  7. Dysuria - UA/M w/rflx Culture, Routine  8. Screening for STDs (sexually transmitted diseases) - NuSwab Vaginitis Plus (VG+)  9. Routine cervical smear - IGP, Aptima HPV   General Counseling: Kathrynn verbalizes understanding of the findings of todays visit and agrees with plan of treatment. I have discussed any further diagnostic evaluation that may be needed or ordered today. We also reviewed her medications today. she has  been encouraged to call the office with any questions or concerns that should arise related to todays visit.    Counseling:    Orders Placed This Encounter  Procedures  . UA/M w/rflx Culture, Routine  . NuSwab Vaginitis Plus (VG+)    No orders of the defined types were placed in this encounter.   This patient was seen by Drema Dallas, PA-C in collaboration with Dr. Clayborn Bigness as a part of collaborative care agreement.  Total time spent:40 Minutes  Time spent includes review of chart, medications, test results, and follow up plan with the patient.     Lavera Guise, MD  Internal Medicine

## 2020-07-18 LAB — UA/M W/RFLX CULTURE, ROUTINE
Bilirubin, UA: NEGATIVE
Glucose, UA: NEGATIVE
Ketones, UA: NEGATIVE
Leukocytes,UA: NEGATIVE
Nitrite, UA: NEGATIVE
Protein,UA: NEGATIVE
RBC, UA: NEGATIVE
Specific Gravity, UA: 1.024 (ref 1.005–1.030)
Urobilinogen, Ur: 0.2 mg/dL (ref 0.2–1.0)
pH, UA: 6 (ref 5.0–7.5)

## 2020-07-18 LAB — MICROSCOPIC EXAMINATION
Bacteria, UA: NONE SEEN
Casts: NONE SEEN /lpf
RBC, Urine: NONE SEEN /hpf (ref 0–2)
WBC, UA: NONE SEEN /hpf (ref 0–5)

## 2020-07-21 LAB — NUSWAB VAGINITIS PLUS (VG+)
Atopobium vaginae: HIGH Score — AB
BVAB 2: HIGH Score — AB
Candida albicans, NAA: NEGATIVE
Candida glabrata, NAA: NEGATIVE
Chlamydia trachomatis, NAA: NEGATIVE
Megasphaera 1: HIGH Score — AB
Neisseria gonorrhoeae, NAA: NEGATIVE
Trich vag by NAA: NEGATIVE

## 2020-07-21 LAB — IGP, APTIMA HPV: HPV Aptima: POSITIVE — AB

## 2020-07-22 ENCOUNTER — Telehealth: Payer: Self-pay

## 2020-07-22 ENCOUNTER — Other Ambulatory Visit: Payer: Self-pay | Admitting: Physician Assistant

## 2020-07-22 DIAGNOSIS — B9689 Other specified bacterial agents as the cause of diseases classified elsewhere: Secondary | ICD-10-CM

## 2020-07-22 MED ORDER — METRONIDAZOLE 500 MG PO TABS: 500.0000 mg | ORAL_TABLET | Freq: Two times a day (BID) | ORAL | 0 refills | Status: DC

## 2020-07-22 NOTE — Telephone Encounter (Signed)
-----   Message from Lauren K McDonough, PA-C sent at 07/22/2020 12:38 PM EDT ----- Please let her know she has BV and I will send an ABX to the pharmacy. Please inform her to not drink alcohol with this medication. 

## 2020-07-22 NOTE — Telephone Encounter (Signed)
LMOM need to let her know she has BV. Left on voice mail ABX have been sent to the pharmacy and she should not drink alcohol with this medication.

## 2020-07-24 ENCOUNTER — Telehealth: Payer: Self-pay

## 2020-07-24 NOTE — Progress Notes (Signed)
LMOM to make sure pt has picked up meds and knows not to drink alcohol with meds.

## 2020-07-24 NOTE — Telephone Encounter (Signed)
Faxed most recent office note for patients disability to [employer] at [phone]. Copy of request placed in scan. Toni Amend

## 2020-07-29 ENCOUNTER — Telehealth: Payer: Self-pay

## 2020-07-29 ENCOUNTER — Encounter: Payer: Self-pay | Admitting: Physician Assistant

## 2020-07-29 NOTE — Telephone Encounter (Signed)
Return to work form dropped off by patient and placed in Lauren's folder. Toni Amend

## 2020-07-31 ENCOUNTER — Telehealth: Payer: Self-pay

## 2020-07-31 NOTE — Telephone Encounter (Signed)
Return to work form completed by provider and faxed to patients employer at [phone], copy placed in scan and patient advised ready for pickup. Toni Amend

## 2020-08-05 ENCOUNTER — Telehealth: Payer: Self-pay

## 2020-08-05 NOTE — Telephone Encounter (Signed)
-----   Message from Carlean Jews, PA-C sent at 07/22/2020 12:38 PM EDT ----- Please let her know she has BV and I will send an ABX to the pharmacy. Please inform her to not drink alcohol with this medication.

## 2020-08-05 NOTE — Telephone Encounter (Signed)
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 We will plan to repeat in 1 year.

## 2020-08-06 NOTE — Telephone Encounter (Signed)
Spoke to pt and made her aware HPV + but negative for any signs of malignancy and that pt should plan to repeat pap in 1 year

## 2020-08-13 ENCOUNTER — Encounter: Payer: Self-pay | Admitting: Physician Assistant

## 2020-08-14 ENCOUNTER — Other Ambulatory Visit: Payer: Self-pay

## 2020-08-14 MED ORDER — FLUCONAZOLE 150 MG PO TABS: ORAL_TABLET | ORAL | 0 refills | Status: DC

## 2020-09-16 ENCOUNTER — Encounter: Payer: Self-pay | Admitting: Physician Assistant

## 2020-09-16 NOTE — Telephone Encounter (Signed)
Done

## 2020-09-18 ENCOUNTER — Ambulatory Visit: Payer: BC Managed Care – PPO | Admitting: Physician Assistant

## 2020-11-06 NOTE — Telephone Encounter (Signed)
Please schedule this pt on 9/9

## 2020-11-06 NOTE — Telephone Encounter (Signed)
Both providers are full that day. Do you want her with you? Your whole day is blocked.

## 2020-11-15 NOTE — Telephone Encounter (Signed)
Please check Nina Wright ( Friday we are booked)

## 2020-11-16 ENCOUNTER — Ambulatory Visit: Payer: BC Managed Care – PPO | Admitting: Physician Assistant

## 2020-11-19 ENCOUNTER — Other Ambulatory Visit: Payer: Self-pay

## 2020-11-19 ENCOUNTER — Encounter: Payer: Self-pay | Admitting: Physician Assistant

## 2020-11-19 ENCOUNTER — Ambulatory Visit: Payer: BC Managed Care – PPO | Admitting: Physician Assistant

## 2020-11-19 DIAGNOSIS — F322 Major depressive disorder, single episode, severe without psychotic features: Secondary | ICD-10-CM | POA: Diagnosis not present

## 2020-11-19 DIAGNOSIS — Z23 Encounter for immunization: Secondary | ICD-10-CM

## 2020-11-19 DIAGNOSIS — G4710 Hypersomnia, unspecified: Secondary | ICD-10-CM | POA: Diagnosis not present

## 2020-11-19 DIAGNOSIS — I071 Rheumatic tricuspid insufficiency: Secondary | ICD-10-CM | POA: Diagnosis not present

## 2020-11-19 DIAGNOSIS — F411 Generalized anxiety disorder: Secondary | ICD-10-CM | POA: Diagnosis not present

## 2020-11-19 NOTE — Progress Notes (Signed)
[facility] [address] [place], [place] [address]  Internal MEDICINE  Office Visit Note  Patient Name: Nina Wright  [id number]  [id number]  Date of Service: 11/21/2020  Chief Complaint  Patient presents with   Follow-up   Anxiety    HPI Pt is here for routine follow up -Takes the hydroxyzine most nights to help with anxiety and sleep. Goes to sleep 10-11, wakes up 7 if going to work early otherwise will sleep in a little later. Wakes up somewhat refreshed but then by lunchtime is very sleepy. Takes naps for an hour and still tired when she wakes up. Nods off at lunch. Always been sleepy and can fall asleep right after lunch most days.  -Can be interested in something and still fall sleep. -Does snore and gasp awake. No morning headaches usually.  -Daytime depression and anxiety has been better, counselor sessions every other week. Not taking lexapro consistently but has it in case. -Will also schedule echo ordered on previous visit to monitor previous dx of TR from when she was in HS  EPWORTH SLEEPINESS SCALE:  Scale:  (0)= no chance of dozing; (1)= slight chance of dozing; (2)= moderate chance of dozing; (3)= high chance of dozing  Chance  Situtation    Sitting and reading: 3    Watching TV: 3    Sitting Inactive in public: 3    As a passenger in car: 3      Lying down to rest: 3    Sitting and talking: 2    Sitting quielty after lunch: 3    In a car, stopped in traffic: 1   TOTAL SCORE:   21 out of 24   Current Medication: Outpatient Encounter Medications as of 11/19/2020  Medication Sig   ergocalciferol (DRISDOL) 1.25 MG (50000 UT) capsule Take one cap q week   escitalopram (LEXAPRO) 10 MG tablet Take 1 tablet (10 mg total) by mouth daily.   hydrOXYzine (VISTARIL) 25 MG capsule Take 1 capsule (25 mg total) by mouth at bedtime.   Multiple Vitamins-Minerals (MULTIVITAMIN WOMEN PO) Take by mouth.   [DISCONTINUED] fluconazole (DIFLUCAN) 150  MG tablet Take 1 tablet by mouth once and may repeat in 3 days if symptoms persist   [DISCONTINUED] metroNIDAZOLE (FLAGYL) 500 MG tablet Take 1 tablet (500 mg total) by mouth 2 (two) times daily.   No facility-administered encounter medications on file as of 11/19/2020.    Surgical History: Past Surgical History:  Procedure Laterality Date   WISDOM TOOTH EXTRACTION     removed only bottom    Medical History: Past Medical History:  Diagnosis Date   Anxiety    Insomnia due to anxiety and fear    Tachycardia    leaky tricuspid valve pt was in high school    Family History: Family History  Problem Relation Age of Onset   Diabetes Mother     Social History   Socioeconomic History   Marital status: Single    Spouse name: Not on file   Number of children: Not on file   Years of education: Not on file   Highest education level: Not on file  Occupational History   Not on file  Tobacco Use   Smoking status: Former    Types: Cigarettes   Smokeless tobacco: Never   Tobacco comments:    age 30  Substance and Sexual Activity   Alcohol use: Yes    Alcohol/week: 2.0 standard drinks    Types: 2  Cans of beer per week    Comment: once a week   Drug use: Never   Sexual activity: Not on file  Other Topics Concern   Not on file  Social History Narrative   Not on file   Social Determinants of Health   Financial Resource Strain: Not on file  Food Insecurity: Not on file  Transportation Needs: Not on file  Physical Activity: Not on file  Stress: Not on file  Social Connections: Not on file  Intimate Partner Violence: Not on file      Review of Systems  Constitutional:  Positive for fatigue. Negative for chills and unexpected weight change.  HENT:  Negative for congestion, postnasal drip, rhinorrhea, sneezing and sore throat.   Eyes:  Negative for redness.  Respiratory:  Negative for cough, chest tightness and shortness of breath.   Cardiovascular:  Negative for chest  pain and palpitations.  Gastrointestinal:  Negative for abdominal pain, constipation, diarrhea, nausea and vomiting.  Genitourinary:  Negative for dysuria and frequency.  Musculoskeletal:  Negative for arthralgias, back pain, joint swelling and neck pain.  Skin:  Negative for rash.  Neurological: Negative.  Negative for tremors and numbness.  Hematological:  Negative for adenopathy. Does not bruise/bleed easily.  Psychiatric/Behavioral:  Positive for behavioral problems (Depression) and sleep disturbance. Negative for self-injury and suicidal ideas. The patient is nervous/anxious.    Vital Signs: BP 122/74   Pulse 67   Temp 97.8 F (36.6 C)   Resp 16   Ht 5\' 4"  (1.626 m)   Wt 186 lb (84.4 kg)   SpO2 99%   BMI 31.93 kg/m    Physical Exam Vitals and nursing note reviewed.  Constitutional:      General: She is not in acute distress.    Appearance: She is well-developed. She is obese. She is not diaphoretic.  HENT:     Head: Normocephalic and atraumatic.     Mouth/Throat:     Pharynx: No oropharyngeal exudate.  Eyes:     Pupils: Pupils are equal, round, and reactive to light.  Neck:     Thyroid: No thyromegaly.     Vascular: No JVD.     Trachea: No tracheal deviation.  Cardiovascular:     Rate and Rhythm: Normal rate and regular rhythm.     Heart sounds: Normal heart sounds. No murmur heard.   No friction rub. No gallop.  Pulmonary:     Effort: Pulmonary effort is normal. No respiratory distress.     Breath sounds: No wheezing or rales.  Chest:     Chest wall: No tenderness.  Abdominal:     General: Bowel sounds are normal.     Palpations: Abdomen is soft.  Musculoskeletal:        General: Normal range of motion.     Cervical back: Normal range of motion and neck supple.  Lymphadenopathy:     Cervical: No cervical adenopathy.  Skin:    General: Skin is warm and dry.  Neurological:     Mental Status: She is alert and oriented to person, place, and time.      Cranial Nerves: No cranial nerve deficit.  Psychiatric:        Behavior: Behavior normal.        Thought Content: Thought content normal.        Judgment: Judgment normal.       Assessment/Plan: 1. Hypersomnia Based on excessive daytime sleepiness, snoring, gasping for air, and elevated BMI will order  PSG for further eval - PSG SLEEP STUDY  2. GAD (generalized anxiety disorder) Stable, continue hydroxyzine as needed and counseling sessions  3. Depression, major, single episode, severe ([facility]) Patient has not been taking Lexapro daily however has it in case depression worsens, will continue with counseling sessions as he seems to be of greatest benefit  4. Tricuspid valve insufficiency, unspecified etiology Will order echo for further monitoring - PSG SLEEP STUDY - ECHOCARDIOGRAM COMPLETE; Future  5. Flu vaccine need - Flu Vaccine MDCK QUAD PF   General Counseling: Murrel verbalizes understanding of the findings of todays visit and agrees with plan of treatment. I have discussed any further diagnostic evaluation that may be needed or ordered today. We also reviewed her medications today. she has been encouraged to call the office with any questions or concerns that should arise related to todays visit.    Orders Placed This Encounter  Procedures   Flu Vaccine MDCK QUAD PF   ECHOCARDIOGRAM COMPLETE   PSG SLEEP STUDY    No orders of the defined types were placed in this encounter.   This patient was seen by Lynn Ito, PA-C in collaboration with Dr. Beverely Risen as a part of collaborative care agreement.   Total time spent:35 Minutes Time spent includes review of chart, medications, test results, and follow up plan with the patient.      Dr Lyndon Code Internal medicine

## 2020-12-01 ENCOUNTER — Telehealth: Payer: Self-pay

## 2020-12-01 NOTE — Telephone Encounter (Signed)
Patient has declined PSG per [facility]. The patient declined PSG due to deductible not being met.

## 2020-12-03 ENCOUNTER — Telehealth: Payer: Self-pay

## 2020-12-03 NOTE — Telephone Encounter (Signed)
Left vm to confirm 12/09/20 ultrasound appointment-Toni 

## 2020-12-09 ENCOUNTER — Other Ambulatory Visit: Payer: Self-pay

## 2020-12-09 ENCOUNTER — Ambulatory Visit: Payer: BC Managed Care – PPO

## 2020-12-09 DIAGNOSIS — I071 Rheumatic tricuspid insufficiency: Secondary | ICD-10-CM

## 2021-01-04 ENCOUNTER — Encounter: Payer: Self-pay | Admitting: Physician Assistant

## 2021-01-06 ENCOUNTER — Telehealth: Payer: Self-pay

## 2021-01-06 NOTE — Telephone Encounter (Signed)
Patient has not had PSG yet because of cost. Patient does not wish to proceed at this time.

## 2021-01-07 ENCOUNTER — Ambulatory Visit: Payer: BC Managed Care – PPO | Admitting: Physician Assistant

## 2021-01-18 ENCOUNTER — Encounter: Payer: Self-pay | Admitting: Physician Assistant

## 2021-01-18 ENCOUNTER — Other Ambulatory Visit: Payer: Self-pay

## 2021-01-18 ENCOUNTER — Ambulatory Visit (INDEPENDENT_AMBULATORY_CARE_PROVIDER_SITE_OTHER): Payer: BC Managed Care – PPO | Admitting: Physician Assistant

## 2021-01-18 DIAGNOSIS — I071 Rheumatic tricuspid insufficiency: Secondary | ICD-10-CM | POA: Diagnosis not present

## 2021-01-18 DIAGNOSIS — R7989 Other specified abnormal findings of blood chemistry: Secondary | ICD-10-CM

## 2021-01-18 DIAGNOSIS — E559 Vitamin D deficiency, unspecified: Secondary | ICD-10-CM | POA: Diagnosis not present

## 2021-01-18 DIAGNOSIS — F322 Major depressive disorder, single episode, severe without psychotic features: Secondary | ICD-10-CM | POA: Diagnosis not present

## 2021-01-18 DIAGNOSIS — F411 Generalized anxiety disorder: Secondary | ICD-10-CM

## 2021-01-18 NOTE — Progress Notes (Signed)
[facility] [place], [place] [address]  Internal MEDICINE  Office Visit Note  Patient Name: Nina Wright  [id number]  [id number]  Date of Service: 01/20/2021  Chief Complaint  Patient presents with   Follow-up    U/s    HPI Pt is here for routine follow up to discuss echo results -Echo showed mild TR and trace PR, but was otherwise normal with normal LV size and EF 66%. -Has been working a lot recently, especially with the holidays coming up. 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 This does make her more tired. -Unfortunately the SS was too expensive for her at this time since she has not met her deductible. Discussed that importance of this test and to let us know if/when she is ready to move forward. In the meantime she was told to be very careful, especially when driving -Seeing therapist every other week--virtual visits. Will have to meet in person at some point soon.  -Not taking lexapro recently, doing ok. Has missed like a week and did not want to restart and discussed with therapist who also stated that it needs to be taken regularly if she is going to take it. Will hold off restarting for now. Has been doing ok since she has been keeping busy with work and seeing therapist regularly -hydroxyzine still taken as needed -Discussed checking her vitamin D to see if it has improved with supplement and will also recheck her thyroid since she had a low Tsh but normal free T4 last check  Current Medication: Outpatient Encounter Medications as of 01/18/2021  Medication Sig   ergocalciferol (DRISDOL) 1.25 MG (50000 UT) capsule Take one cap q week   escitalopram (LEXAPRO) 10 MG tablet Take 1 tablet (10 mg total) by mouth daily.   hydrOXYzine (VISTARIL) 25 MG capsule Take 1 capsule (25 mg total) by mouth at bedtime.   Multiple Vitamins-Minerals (MULTIVITAMIN WOMEN PO) Take by mouth.   No facility-administered encounter medications on  file as of 01/18/2021.    Surgical History: Past Surgical History:  Procedure Laterality Date   WISDOM TOOTH EXTRACTION     removed only bottom    Medical History: Past Medical History:  Diagnosis Date   Anxiety    Insomnia due to anxiety and fear    Tachycardia    leaky tricuspid valve pt was in high school    Family History: Family History  Problem Relation Age of Onset   Diabetes Mother     Social History   Socioeconomic History   Marital status: Single    Spouse name: Not on file   Number of children: Not on file   Years of education: Not on file   Highest education level: Not on file  Occupational History   Not on file  Tobacco Use   Smoking status: Former    Types: Cigarettes   Smokeless tobacco: Never   Tobacco comments:    age 34  Substance and Sexual Activity   Alcohol use: Yes    Alcohol/week: 2.0 standard drinks    Types: 2 Cans of beer per week    Comment: once a week   Drug use: Never   Sexual activity: Not on file  Other Topics Concern   Not on file  Social History Narrative   Not on file   Social Determinants of Health   Financial Resource Strain: Not on file  Food Insecurity: Not on file  Transportation Needs:  Not on file  Physical Activity: Not on file  Stress: Not on file  Social Connections: Not on file  Intimate Partner Violence: Not on file      Review of Systems  Constitutional:  Positive for fatigue. Negative for chills and unexpected weight change.  HENT:  Negative for congestion, postnasal drip, rhinorrhea, sneezing and sore throat.   Eyes:  Negative for redness.  Respiratory:  Negative for cough, chest tightness and shortness of breath.   Cardiovascular:  Negative for chest pain and palpitations.  Gastrointestinal:  Negative for abdominal pain, constipation, diarrhea, nausea and vomiting.  Genitourinary:  Negative for dysuria and frequency.  Musculoskeletal:  Negative for arthralgias, back pain, joint swelling and  neck pain.  Skin:  Negative for rash.  Neurological: Negative.  Negative for tremors and numbness.  Hematological:  Negative for adenopathy. Does not bruise/bleed easily.  Psychiatric/Behavioral:  Positive for behavioral problems (Depression) and sleep disturbance. Negative for self-injury and suicidal ideas. The patient is nervous/anxious.    Vital Signs: BP 120/84   Pulse 67   Temp 98 F (36.7 C)   Resp 16   Ht _0  (1.626 m)   Wt 190 lb (86.2 kg)   SpO2 99%   BMI 32.61 kg/m    Physical Exam Vitals and nursing note reviewed.  Constitutional:      General: She is not in acute distress.    Appearance: She is well-developed. She is obese. She is not diaphoretic.  HENT:     Head: Normocephalic and atraumatic.     Mouth/Throat:     Pharynx: No oropharyngeal exudate.  Eyes:     Pupils: Pupils are equal, round, and reactive to light.  Neck:     Thyroid: No thyromegaly.     Vascular: No JVD.     Trachea: No tracheal deviation.  Cardiovascular:     Rate and Rhythm: Normal rate and regular rhythm.     Heart sounds: Normal heart sounds. No murmur heard.   No friction rub. No gallop.  Pulmonary:     Effort: Pulmonary effort is normal. No respiratory distress.     Breath sounds: No wheezing or rales.  Chest:     Chest wall: No tenderness.  Abdominal:     General: Bowel sounds are normal.     Palpations: Abdomen is soft.  Musculoskeletal:        General: Normal range of motion.     Cervical back: Normal range of motion and neck supple.  Lymphadenopathy:     Cervical: No cervical adenopathy.  Skin:    General: Skin is warm and dry.  Neurological:     Mental Status: She is alert and oriented to person, place, and time.     Cranial Nerves: No cranial nerve deficit.  Psychiatric:        Behavior: Behavior normal.        Thought Content: Thought content normal.        Judgment: Judgment normal.       Assessment/Plan: 1. GAD (generalized anxiety disorder) Continue  hydroxyzine as needed and continue with therapy  2. Depression, major, single episode, severe ([place]) Will hold off restarting lexapro for now and will continue with therapy  3. Vitamin D deficiency Recheck labs - VITAMIN D 25 Hydroxy (Vit-D Deficiency, Fractures); Future  4. Low TSH level Will recheck labs - TSH + free T4  5. Tricuspid valve insufficiency, unspecified etiology Mild TR and trace PR on echo, will continue to monitor  General Counseling: Rhia verbalizes understanding of the findings of todays visit and agrees with plan of treatment. I have discussed any further diagnostic evaluation that may be needed or ordered today. We also reviewed her medications today. she has been encouraged to call the office with any questions or concerns that should arise related to todays visit.    Orders Placed This Encounter  Procedures   TSH + free T4   VITAMIN D 25 Hydroxy (Vit-D Deficiency, Fractures)    No orders of the defined types were placed in this encounter.   This patient was seen by Drema Dallas, PA-C in collaboration with Dr. Clayborn Bigness as a part of collaborative care agreement.   Total time spent:30 Minutes Time spent includes review of chart, medications, test results, and follow up plan with the patient.      Dr Lavera Guise Internal medicine

## 2021-02-25 ENCOUNTER — Encounter: Payer: Self-pay | Admitting: Physician Assistant

## 2021-03-02 ENCOUNTER — Encounter: Payer: Self-pay | Admitting: Physician Assistant

## 2021-04-09 ENCOUNTER — Other Ambulatory Visit: Payer: Self-pay | Admitting: Physician Assistant

## 2021-04-09 ENCOUNTER — Encounter: Payer: Self-pay | Admitting: Physician Assistant

## 2021-04-09 MED ORDER — FLUCONAZOLE 150 MG PO TABS: 150.0000 mg | ORAL_TABLET | Freq: Once | ORAL | 0 refills | Status: AC

## 2021-07-23 ENCOUNTER — Encounter: Payer: BC Managed Care – PPO | Admitting: Physician Assistant

## 2021-07-26 ENCOUNTER — Telehealth: Payer: Self-pay

## 2021-07-26 NOTE — Telephone Encounter (Signed)
Left vm and sent mychart message to confirm 07/30/21 appointment-Toni 

## 2021-07-30 ENCOUNTER — Ambulatory Visit (INDEPENDENT_AMBULATORY_CARE_PROVIDER_SITE_OTHER): Payer: BC Managed Care – PPO | Admitting: Physician Assistant

## 2021-07-30 ENCOUNTER — Encounter: Payer: Self-pay | Admitting: Physician Assistant

## 2021-07-30 DIAGNOSIS — F411 Generalized anxiety disorder: Secondary | ICD-10-CM | POA: Diagnosis not present

## 2021-07-30 DIAGNOSIS — Z0001 Encounter for general adult medical examination with abnormal findings: Secondary | ICD-10-CM | POA: Diagnosis not present

## 2021-07-30 DIAGNOSIS — E559 Vitamin D deficiency, unspecified: Secondary | ICD-10-CM

## 2021-07-30 DIAGNOSIS — R5383 Other fatigue: Secondary | ICD-10-CM

## 2021-07-30 DIAGNOSIS — Z01419 Encounter for gynecological examination (general) (routine) without abnormal findings: Secondary | ICD-10-CM

## 2021-07-30 DIAGNOSIS — R300 Dysuria: Secondary | ICD-10-CM

## 2021-07-30 DIAGNOSIS — R7989 Other specified abnormal findings of blood chemistry: Secondary | ICD-10-CM

## 2021-07-30 DIAGNOSIS — F322 Major depressive disorder, single episode, severe without psychotic features: Secondary | ICD-10-CM

## 2021-07-30 DIAGNOSIS — E538 Deficiency of other specified B group vitamins: Secondary | ICD-10-CM

## 2021-07-30 NOTE — Progress Notes (Signed)
[facility] [address] [place], [place] [address]  Internal MEDICINE  Office Visit Note  Patient Name: Nina Wright  [id number]  [id number]  Date of Service: 07/30/2021  Chief Complaint  Patient presents with   Annual Exam     HPI Pt is here for routine health maintenance examination -has not had labs done, will re-order as all labs are not due rather than just thyroid follow up -Still talking with therapist consistently, twice per month and is doing well with this -has not eaten meat for the past 2 years, but does like pasta and has been gaining some weight that she had lost back. Will work on limiting pasta and improving diet and exercise -will start back in the gym -sleep has been better -Doesn't really ever take hydroxyzine and not taking lexapro at all. -Had her pap done last year and was NILM, but was HPV positive therefore due for repeat. Declines this today, but will schedule to come back for pap only in a few weeks -reports she is handling work stress better now, it is still there but she is managing  Current Medication: Outpatient Encounter Medications as of 07/30/2021  Medication Sig   ergocalciferol (DRISDOL) 1.25 MG (50000 UT) capsule Take one cap q week   escitalopram (LEXAPRO) 10 MG tablet Take 1 tablet (10 mg total) by mouth daily.   hydrOXYzine (VISTARIL) 25 MG capsule Take 1 capsule (25 mg total) by mouth at bedtime.   Multiple Vitamins-Minerals (MULTIVITAMIN WOMEN PO) Take by mouth.   No facility-administered encounter medications on file as of 07/30/2021.    Surgical History: Past Surgical History:  Procedure Laterality Date   WISDOM TOOTH EXTRACTION     removed only bottom    Medical History: Past Medical History:  Diagnosis Date   Anxiety    Insomnia due to anxiety and fear    Tachycardia    leaky tricuspid valve pt was in high school    Family History: Family History  Problem Relation Age of Onset   Diabetes Mother        Review of Systems  Constitutional:  Positive for fatigue. Negative for chills and unexpected weight change.  HENT:  Negative for congestion, postnasal drip, rhinorrhea, sneezing and sore throat.   Eyes:  Negative for redness.  Respiratory:  Negative for cough, chest tightness and shortness of breath.   Cardiovascular:  Negative for chest pain and palpitations.  Gastrointestinal:  Negative for abdominal pain, constipation, diarrhea, nausea and vomiting.  Genitourinary:  Negative for dysuria and frequency.  Musculoskeletal:  Negative for arthralgias, back pain, joint swelling and neck pain.  Skin:  Negative for rash.  Neurological: Negative.  Negative for tremors and numbness.  Hematological:  Negative for adenopathy. Does not bruise/bleed easily.  Psychiatric/Behavioral:  Positive for behavioral problems (Depression) and sleep disturbance. Negative for self-injury and suicidal ideas. The patient is nervous/anxious.     Vital Signs: BP 126/78   Pulse 89   Temp 97.7 F (36.5 C)   Resp 16   Ht 5\' 4"  (1.626 m)   Wt 202 lb 9.6 oz (91.9 kg)   LMP 07/30/2021 (Exact Date)   SpO2 99%   BMI 34.78 kg/m    Physical Exam Vitals and nursing note reviewed.  Constitutional:      General: She is not in acute distress.    Appearance: She is well-developed. She is obese. She is not diaphoretic.  HENT:     Head: Normocephalic and atraumatic.  Mouth/Throat:     Pharynx: No oropharyngeal exudate.  Eyes:     Pupils: Pupils are equal, round, and reactive to light.  Neck:     Thyroid: No thyromegaly.     Vascular: No JVD.     Trachea: No tracheal deviation.  Cardiovascular:     Rate and Rhythm: Normal rate and regular rhythm.     Heart sounds: Normal heart sounds. No murmur heard.   No friction rub. No gallop.  Pulmonary:     Effort: Pulmonary effort is normal. No respiratory distress.     Breath sounds: No wheezing or rales.  Chest:     Chest wall: No tenderness.  Breasts:     Right: Normal. No mass.     Left: Normal. No mass.  Abdominal:     General: Bowel sounds are normal.     Palpations: Abdomen is soft.  Musculoskeletal:        General: Normal range of motion.     Cervical back: Normal range of motion and neck supple.  Lymphadenopathy:     Cervical: No cervical adenopathy.  Skin:    General: Skin is warm and dry.  Neurological:     Mental Status: She is alert and oriented to person, place, and time.     Cranial Nerves: No cranial nerve deficit.  Psychiatric:        Behavior: Behavior normal.        Thought Content: Thought content normal.        Judgment: Judgment normal.     LABS: No results found for this or any previous visit (from the past 2160 hour(s)).      Assessment/Plan: 1. Encounter for general adult medical examination with abnormal findings CPE performed, routine fasting labs ordered, due for updated Pap due to positive HPV last year with NILM.  Patient will schedule for Pap only visit in the next few weeks  2. GAD (generalized anxiety disorder) Stable, continue visits with therapist  3. Depression, major, single episode, severe (HCC) Stable, continue visits with therapist  4. Low TSH level We will need to recheck labs - TSH + free T4  5. Vitamin D deficiency - VITAMIN D 25 Hydroxy (Vit-D Deficiency, Fractures)  6. B12 deficiency - B12 and Folate Panel  7. Other fatigue - VITAMIN D 25 Hydroxy (Vit-D Deficiency, Fractures) - B12 and Folate Panel - TSH + free T4 - Lipid Panel With LDL/HDL Ratio - Comprehensive metabolic panel - CBC w/Diff/Platelet  8. Visit for gynecologic examination Breast exam performed  9. Dysuria - UA/M w/rflx Culture, Routine   General Counseling: Ephrata verbalizes understanding of the findings of todays visit and agrees with plan of treatment. I have discussed any further diagnostic evaluation that may be needed or ordered today. We also reviewed her medications today. she has been  encouraged to call the office with any questions or concerns that should arise related to todays visit.    Counseling:    Orders Placed This Encounter  Procedures   VITAMIN D 25 Hydroxy (Vit-D Deficiency, Fractures)   B12 and Folate Panel   TSH + free T4   Lipid Panel With LDL/HDL Ratio   Comprehensive metabolic panel   CBC w/Diff/Platelet   UA/M w/rflx Culture, Routine    No orders of the defined types were placed in this encounter.   This patient was seen by Lynn Ito, PA-C in collaboration with Dr. Beverely Risen as a part of collaborative care agreement.  Total time spent:35 Minutes  Time spent includes review of chart, medications, test results, and follow up plan with the patient.     Lavera Guise, MD  Internal Medicine

## 2021-07-31 LAB — UA/M W/RFLX CULTURE, ROUTINE
Bilirubin, UA: NEGATIVE
Glucose, UA: NEGATIVE
Ketones, UA: NEGATIVE
Leukocytes,UA: NEGATIVE
Nitrite, UA: NEGATIVE
Protein,UA: NEGATIVE
RBC, UA: NEGATIVE
Specific Gravity, UA: 1.025 (ref 1.005–1.030)
Urobilinogen, Ur: 0.2 mg/dL (ref 0.2–1.0)
pH, UA: 5.5 (ref 5.0–7.5)

## 2021-07-31 LAB — MICROSCOPIC EXAMINATION
Bacteria, UA: NONE SEEN
Casts: NONE SEEN /lpf

## 2021-08-12 ENCOUNTER — Ambulatory Visit: Payer: BC Managed Care – PPO | Admitting: Physician Assistant

## 2021-08-12 ENCOUNTER — Encounter: Payer: Self-pay | Admitting: Physician Assistant

## 2021-08-12 DIAGNOSIS — Z124 Encounter for screening for malignant neoplasm of cervix: Secondary | ICD-10-CM | POA: Diagnosis not present

## 2021-08-12 DIAGNOSIS — Z01419 Encounter for gynecological examination (general) (routine) without abnormal findings: Secondary | ICD-10-CM

## 2021-08-12 DIAGNOSIS — Z113 Encounter for screening for infections with a predominantly sexual mode of transmission: Secondary | ICD-10-CM

## 2021-08-12 DIAGNOSIS — Z23 Encounter for immunization: Secondary | ICD-10-CM | POA: Diagnosis not present

## 2021-08-12 DIAGNOSIS — E559 Vitamin D deficiency, unspecified: Secondary | ICD-10-CM

## 2021-08-12 MED ORDER — ERGOCALCIFEROL 1.25 MG (50000 UT) PO CAPS: ORAL_CAPSULE | ORAL | 3 refills | Status: DC

## 2021-08-12 MED ORDER — TETANUS-DIPHTH-ACELL PERTUSSIS 5-2.5-18.5 LF-MCG/0.5 IM SUSP: 0.5000 mL | Freq: Once | INTRAMUSCULAR | 0 refills | Status: AC

## 2021-08-12 NOTE — Progress Notes (Cosign Needed)
[facility] [address] [place], [place] [address]  Internal MEDICINE  Office Visit Note  Patient Name: Nina Wright  [id number]  [id number]  Date of Service: 08/24/2021   Chief Complaint  Patient presents with   Follow-up   Anxiety     HPI Pt is here for a pap smear only -She had her CPE but was not prepared for pap at that time and opted to schedule separately. She is in need of repeat pap due to positive HPV finding last year -does state that about once a week has some milky discharge.  -Is working on blood work  Current Medication: Outpatient Encounter Medications as of 08/12/2021  Medication Sig   ergocalciferol (DRISDOL) 1.25 MG (50000 UT) capsule Take one cap q week   escitalopram (LEXAPRO) 10 MG tablet Take 1 tablet (10 mg total) by mouth daily.   hydrOXYzine (VISTARIL) 25 MG capsule Take 1 capsule (25 mg total) by mouth at bedtime.   Multiple Vitamins-Minerals (MULTIVITAMIN WOMEN PO) Take by mouth.   [DISCONTINUED] ergocalciferol (DRISDOL) 1.25 MG (50000 UT) capsule Take one cap q week   [DISCONTINUED] Tdap (BOOSTRIX) 5-2.5-18.5 LF-MCG/0.5 injection Inject 0.5 mLs into the muscle once.   [EXPIRED] Tdap (BOOSTRIX) 5-2.5-18.5 LF-MCG/0.5 injection Inject 0.5 mLs into the muscle once for 1 dose.   No facility-administered encounter medications on file as of 08/12/2021.    Surgical History: Past Surgical History:  Procedure Laterality Date   WISDOM TOOTH EXTRACTION     removed only bottom    Medical History: Past Medical History:  Diagnosis Date   Anxiety    Insomnia due to anxiety and fear    Tachycardia    leaky tricuspid valve pt was in high school    Family History: Family History  Problem Relation Age of Onset   Diabetes Mother     Social History   Socioeconomic History   Marital status: Single    Spouse name: Not on file   Number of children: Not on file   Years of education: Not on file   Highest education level: Not on file   Occupational History   Not on file  Tobacco Use   Smoking status: Former    Types: Cigarettes   Smokeless tobacco: Never   Tobacco comments:    age 5  Substance and Sexual Activity   Alcohol use: Yes    Alcohol/week: 2.0 standard drinks of alcohol    Types: 2 Cans of beer per week    Comment: once a week   Drug use: Never   Sexual activity: Not on file  Other Topics Concern   Not on file  Social History Narrative   Not on file   Social Determinants of Health   Financial Resource Strain: Not on file  Food Insecurity: Not on file  Transportation Needs: Not on file  Physical Activity: Not on file  Stress: Not on file  Social Connections: Not on file  Intimate Partner Violence: Not on file     Review of Systems  Constitutional:  Positive for fatigue. Negative for chills and unexpected weight change.  HENT:  Negative for congestion, postnasal drip, rhinorrhea, sneezing and sore throat.   Eyes:  Negative for redness.  Respiratory:  Negative for cough, chest tightness and shortness of breath.   Cardiovascular:  Negative for chest pain and palpitations.  Gastrointestinal:  Negative for abdominal pain, constipation, diarrhea, nausea and vomiting.  Genitourinary:  Positive for vaginal discharge. Negative for dysuria and frequency.  Musculoskeletal:  Negative for arthralgias, back pain, joint swelling and neck pain.  Skin:  Negative for rash.  Neurological: Negative.  Negative for tremors and numbness.  Hematological:  Negative for adenopathy. Does not bruise/bleed easily.  Psychiatric/Behavioral:  Positive for behavioral problems (Depression) and sleep disturbance. Negative for self-injury and suicidal ideas. The patient is nervous/anxious.      Vital signs: BP 131/80   Pulse 79   Temp 98.9 F (37.2 C)   Resp 16   Ht 5\' 4"  (1.626 m)   Wt 198 lb 6.4 oz (90 kg)   LMP 07/30/2021 (Exact Date)   SpO2 98%   BMI 34.06 kg/m    Physical Exam Vitals and nursing note  reviewed.  Constitutional:      Appearance: Normal appearance.  HENT:     Head: Normocephalic and atraumatic.  Cardiovascular:     Rate and Rhythm: Normal rate and regular rhythm.  Pulmonary:     Effort: Pulmonary effort is normal.     Breath sounds: Normal breath sounds.  Abdominal:     Tenderness: There is no abdominal tenderness.  Genitourinary:    Vagina: Vaginal discharge present.     Comments: Pap performed Skin:    General: Skin is warm and dry.  Neurological:     General: No focal deficit present.     Mental Status: She is alert.  Psychiatric:        Mood and Affect: Mood normal.       Assessment/Plan: 1. Visit for gynecologic examination Pap performed  2. Routine cervical smear - IGP, Aptima HPV  3. Routine screening for STI (sexually transmitted infection) - NuSwab Vaginitis Plus (VG+)  4. Need for Tdap vaccination - Tdap (BOOSTRIX) 5-2.5-18.5 LF-MCG/0.5 injection; Inject 0.5 mLs into the muscle once for 1 dose.  Dispense: 0.5 mL; Refill: 0  5. Vitamin D deficiency - ergocalciferol (DRISDOL) 1.25 MG (50000 UT) capsule; Take one cap q week  Dispense: 12 capsule; Refill: 3   General Counseling: Deni verbalizes understanding of the findings of todays visit and agrees with plan of treatment. I have discussed any further diagnostic evaluation that may be needed or ordered today. We also reviewed her medications today. she has been encouraged to call the office with any questions or concerns that should arise related to todays visit.    Counseling:    Orders Placed This Encounter  Procedures   NuSwab Vaginitis Plus (VG+)    Meds ordered this encounter  Medications   Tdap (BOOSTRIX) 5-2.5-18.5 LF-MCG/0.5 injection    Sig: Inject 0.5 mLs into the muscle once for 1 dose.    Dispense:  0.5 mL    Refill:  0   ergocalciferol (DRISDOL) 1.25 MG (50000 UT) capsule    Sig: Take one cap q week    Dispense:  12 capsule    Refill:  3    Time spent:25  Minutes

## 2021-08-14 LAB — NUSWAB VAGINITIS PLUS (VG+)
Atopobium vaginae: HIGH Score — AB
BVAB 2: HIGH Score — AB
Candida albicans, NAA: NEGATIVE
Candida glabrata, NAA: NEGATIVE
Chlamydia trachomatis, NAA: NEGATIVE
Megasphaera 1: HIGH Score — AB
Neisseria gonorrhoeae, NAA: NEGATIVE
Trich vag by NAA: POSITIVE — AB

## 2021-08-16 ENCOUNTER — Other Ambulatory Visit: Payer: Self-pay | Admitting: Physician Assistant

## 2021-08-16 DIAGNOSIS — A599 Trichomoniasis, unspecified: Secondary | ICD-10-CM

## 2021-08-16 DIAGNOSIS — B9689 Other specified bacterial agents as the cause of diseases classified elsewhere: Secondary | ICD-10-CM

## 2021-08-16 MED ORDER — METRONIDAZOLE 500 MG PO TABS: 500.0000 mg | ORAL_TABLET | Freq: Two times a day (BID) | ORAL | 0 refills | Status: DC

## 2021-08-18 LAB — IGP, APTIMA HPV: HPV Aptima: NEGATIVE

## 2021-08-18 NOTE — Progress Notes (Signed)
Message to desha to call pt

## 2021-08-19 ENCOUNTER — Telehealth: Payer: Self-pay

## 2021-08-19 NOTE — Telephone Encounter (Signed)
-----   Message from Lauren K McDonough, PA-C sent at 08/18/2021  1:30 PM EDT ----- Please let her know that her pap results were normal and her HPV testing was negative this year. Please also see result note about trich and BV findings requiring treatment as I do not see a notification where patient has been notified yet--please explain those findings if they have not been conveyed yet 

## 2021-08-19 NOTE — Telephone Encounter (Signed)
LMOM for pt to return call about lab results °

## 2021-08-20 ENCOUNTER — Telehealth: Payer: Self-pay

## 2021-08-20 NOTE — Telephone Encounter (Signed)
Spoke to pt and informed her of pap results and that she was positive for Trich and BV.  Pt is aware not to drink alcohol while on medication, and pt informed to make sure her partner gets treated also.  Pt was aware and understood.

## 2021-08-20 NOTE — Telephone Encounter (Signed)
-----   Message from Carlean Jews, PA-C sent at 08/18/2021  1:30 PM EDT ----- Please let her know that her pap results were normal and her HPV testing was negative this year. Please also see result note about trich and BV findings requiring treatment as I do not see a notification where patient has been notified yet--please explain those findings if they have not been conveyed yet

## 2021-12-13 ENCOUNTER — Ambulatory Visit: Payer: BC Managed Care – PPO | Admitting: Physician Assistant

## 2022-08-05 ENCOUNTER — Ambulatory Visit (INDEPENDENT_AMBULATORY_CARE_PROVIDER_SITE_OTHER): Payer: BC Managed Care – PPO | Admitting: Physician Assistant

## 2022-08-05 ENCOUNTER — Encounter: Payer: Self-pay | Admitting: Physician Assistant

## 2022-08-05 VITALS — BP 115/85 | HR 83 | Temp 98.0°F | Resp 16 | Ht 64.0 in | Wt 211.8 lb

## 2022-08-05 DIAGNOSIS — F411 Generalized anxiety disorder: Secondary | ICD-10-CM

## 2022-08-05 DIAGNOSIS — Z0001 Encounter for general adult medical examination with abnormal findings: Secondary | ICD-10-CM | POA: Diagnosis not present

## 2022-08-05 DIAGNOSIS — R7989 Other specified abnormal findings of blood chemistry: Secondary | ICD-10-CM | POA: Diagnosis not present

## 2022-08-05 DIAGNOSIS — R002 Palpitations: Secondary | ICD-10-CM | POA: Diagnosis not present

## 2022-08-05 DIAGNOSIS — E782 Mixed hyperlipidemia: Secondary | ICD-10-CM

## 2022-08-05 DIAGNOSIS — R5383 Other fatigue: Secondary | ICD-10-CM

## 2022-08-05 DIAGNOSIS — E538 Deficiency of other specified B group vitamins: Secondary | ICD-10-CM

## 2022-08-05 DIAGNOSIS — E559 Vitamin D deficiency, unspecified: Secondary | ICD-10-CM | POA: Diagnosis not present

## 2022-08-05 DIAGNOSIS — R300 Dysuria: Secondary | ICD-10-CM

## 2022-08-05 MED ORDER — HYDROXYZINE PAMOATE 25 MG PO CAPS: 25.0000 mg | ORAL_CAPSULE | Freq: Every day | ORAL | 1 refills | Status: DC

## 2022-08-05 NOTE — Progress Notes (Signed)
[facility] [address] [place], [place] [address]  Internal MEDICINE  Office Visit Note  Patient Name: Nina Wright  [id number]  [id number]  Date of Service: 08/09/2022  Chief Complaint  Patient presents with   Annual Exam   Anxiety   Quality Metric Gaps    TDAP     HPI Pt is here for routine health maintenance examination -may be due for tdap and will check with pharmacy -Just got a gym membership and will be starting Monday, has gained weight since last visit and wants to work on this -anxiety worse lately, mainly at night impacting sleep, did have a panic attack like incident one night. Has been off meds for awhile and would like to restart hydroxyzine as needed at night. Wants to hold off on lexapro for now -not seeing therapist as often as she was. Been a few months and will schedule a visit -on cycle currently -Does have some palpitations occasionally, likely due to anxiety but would be interested in zio monitor, however is concerned about cost and will call about zio monitor cost/coverage first and let office know if she would like to move forward -due for labs  Current Medication: Outpatient Encounter Medications as of 08/05/2022  Medication Sig   ergocalciferol (DRISDOL) 1.25 MG (50000 UT) capsule Take one cap q week   Multiple Vitamins-Minerals (MULTIVITAMIN WOMEN PO) Take by mouth.   [DISCONTINUED] escitalopram (LEXAPRO) 10 MG tablet Take 1 tablet (10 mg total) by mouth daily.   [DISCONTINUED] hydrOXYzine (VISTARIL) 25 MG capsule Take 1 capsule (25 mg total) by mouth at bedtime.   [DISCONTINUED] metroNIDAZOLE (FLAGYL) 500 MG tablet Take 1 tablet (500 mg total) by mouth 2 (two) times daily.   hydrOXYzine (VISTARIL) 25 MG capsule Take 1 capsule (25 mg total) by mouth at bedtime.   No facility-administered encounter medications on file as of 08/05/2022.    Surgical History: Past Surgical History:  Procedure Laterality Date   WISDOM TOOTH  EXTRACTION     removed only bottom    Medical History: Past Medical History:  Diagnosis Date   Anxiety    Insomnia due to anxiety and fear    Tachycardia    leaky tricuspid valve pt was in high school    Family History: Family History  Problem Relation Age of Onset   Diabetes Mother       Review of Systems  Constitutional:  Negative for chills and unexpected weight change.  HENT:  Negative for congestion, postnasal drip, rhinorrhea, sneezing and sore throat.   Eyes:  Negative for redness.  Respiratory:  Negative for cough, chest tightness and shortness of breath.   Cardiovascular:  Positive for palpitations. Negative for chest pain.  Gastrointestinal:  Negative for abdominal pain, constipation, diarrhea, nausea and vomiting.  Genitourinary:  Negative for dysuria and frequency.  Musculoskeletal:  Negative for arthralgias, back pain, joint swelling and neck pain.  Skin:  Negative for rash.  Neurological: Negative.  Negative for tremors and numbness.  Hematological:  Negative for adenopathy. Does not bruise/bleed easily.  Psychiatric/Behavioral:  Positive for sleep disturbance. Negative for self-injury and suicidal ideas. Behavioral problem: Depression.The patient is nervous/anxious.      Vital Signs: BP 115/85   Pulse 83   Temp 98 F (36.7 C)   Resp 16   Ht 5\' 4"  (1.626 m)   Wt 211 lb 12.8 oz (96.1 kg)   SpO2 99%   BMI 36.36 kg/m    Physical Exam Vitals and nursing note  reviewed.  Constitutional:      General: She is not in acute distress.    Appearance: She is well-developed. She is obese. She is not diaphoretic.  HENT:     Head: Normocephalic and atraumatic.     Mouth/Throat:     Pharynx: No oropharyngeal exudate.  Eyes:     Pupils: Pupils are equal, round, and reactive to light.  Neck:     Thyroid: No thyromegaly.     Vascular: No JVD.     Trachea: No tracheal deviation.  Cardiovascular:     Rate and Rhythm: Normal rate and regular rhythm.     Heart  sounds: Normal heart sounds. No murmur heard.    No friction rub. No gallop.  Pulmonary:     Effort: Pulmonary effort is normal. No respiratory distress.     Breath sounds: No wheezing or rales.  Chest:     Chest wall: No tenderness.  Breasts:    Right: Normal. No mass.     Left: Normal. No mass.  Abdominal:     General: Bowel sounds are normal.     Palpations: Abdomen is soft.     Tenderness: There is no abdominal tenderness.  Musculoskeletal:        General: Normal range of motion.     Cervical back: Normal range of motion and neck supple.  Lymphadenopathy:     Cervical: No cervical adenopathy.  Skin:    General: Skin is warm and dry.  Neurological:     Mental Status: She is alert and oriented to person, place, and time.     Cranial Nerves: No cranial nerve deficit.  Psychiatric:        Behavior: Behavior normal.        Thought Content: Thought content normal.        Judgment: Judgment normal.      LABS: Recent Results (from the past 2160 hour(s))  UA/M w/rflx Culture, Routine     Status: None   Collection Time: 08/05/22  1:31 PM   Specimen: Urine   Urine  Result Value Ref Range   Specific Gravity, UA 1.023 1.005 - 1.030   pH, UA 6.0 5.0 - 7.5   Color, UA Yellow Yellow   Appearance Ur Clear Clear   Leukocytes,UA Negative Negative   Protein,UA Trace Negative/Trace   Glucose, UA Negative Negative   Ketones, UA Negative Negative   RBC, UA Negative Negative   Bilirubin, UA Negative Negative   Urobilinogen, Ur 0.2 0.2 - 1.0 mg/dL   Nitrite, UA Negative Negative   Microscopic Examination Comment     Comment: Microscopic follows if indicated.   Microscopic Examination See below:     Comment: Microscopic was indicated and was performed.   Urinalysis Reflex Comment     Comment: This specimen will not reflex to a Urine Culture.  Microscopic Examination     Status: None   Collection Time: 08/05/22  1:31 PM   Urine  Result Value Ref Range   WBC, UA None seen 0 - 5  /hpf   RBC, Urine 0-2 0 - 2 /hpf   Epithelial Cells (non renal) 0-10 0 - 10 /hpf   Casts None seen None seen /lpf   Bacteria, UA None seen None seen/Few        Assessment/Plan: 1. Encounter for general adult medical examination with abnormal findings Cpe performed, labs ordered, will check tdap   2. GAD (generalized anxiety disorder) Will restart hydroxyzine prn, may call if she  would like to restart lexapro and will follow up in 1 month - hydrOXYzine (VISTARIL) 25 MG capsule; Take 1 capsule (25 mg total) by mouth at bedtime.  Dispense: 30 capsule; Refill: 1  3. Palpitations Likely secondary to anxiety, however would consider long term monitor for further evaluation. Pt would like to call about cost/coverage first and will notify office if she would like to move forward with order. Will monitor symptoms and see if correlations. Will also check labs and may need to reconsider sleep study--declined due to cost previously  4. Vitamin D deficiency - TSH+T4F+T3Free+ThyAbs+TPO+VD25  5. Low TSH level - TSH+T4F+T3Free+ThyAbs+TPO+VD25  6. B12 deficiency - B12 and Folate Panel  7. Mixed hyperlipidemia - Lipid Panel With LDL/HDL Ratio  8. Other fatigue - CBC w/Diff/Platelet - Comprehensive metabolic panel - ZOX+W9U+E4VWUJ+WJXBJY+NWG+NF62 - B12 and Folate Panel - Lipid Panel With LDL/HDL Ratio  9. Dysuria - UA/M w/rflx Culture, Routine   General Counseling: Autum verbalizes understanding of the findings of todays visit and agrees with plan of treatment. I have discussed any further diagnostic evaluation that may be needed or ordered today. We also reviewed her medications today. she has been encouraged to call the office with any questions or concerns that should arise related to todays visit.    Counseling:    Orders Placed This Encounter  Procedures   Microscopic Examination   UA/M w/rflx Culture, Routine   CBC w/Diff/Platelet   Comprehensive metabolic panel    TSH+T4F+T3Free+ThyAbs+TPO+VD25   B12 and Folate Panel   Lipid Panel With LDL/HDL Ratio    Meds ordered this encounter  Medications   hydrOXYzine (VISTARIL) 25 MG capsule    Sig: Take 1 capsule (25 mg total) by mouth at bedtime.    Dispense:  30 capsule    Refill:  1    This patient was seen by Lynn Ito, PA-C in collaboration with Dr. Beverely Risen as a part of collaborative care agreement.  Total time spent:35 Minutes  Time spent includes review of chart, medications, test results, and follow up plan with the patient.     Lyndon Code, MD  Internal Medicine

## 2022-08-06 LAB — UA/M W/RFLX CULTURE, ROUTINE
Bilirubin, UA: NEGATIVE
Glucose, UA: NEGATIVE
Ketones, UA: NEGATIVE
Leukocytes,UA: NEGATIVE
Nitrite, UA: NEGATIVE
RBC, UA: NEGATIVE
Specific Gravity, UA: 1.023 (ref 1.005–1.030)
Urobilinogen, Ur: 0.2 mg/dL (ref 0.2–1.0)
pH, UA: 6 (ref 5.0–7.5)

## 2022-08-06 LAB — MICROSCOPIC EXAMINATION
Bacteria, UA: NONE SEEN
Casts: NONE SEEN /lpf
WBC, UA: NONE SEEN /hpf (ref 0–5)

## 2022-09-16 ENCOUNTER — Encounter: Payer: Self-pay | Admitting: Physician Assistant

## 2022-09-16 ENCOUNTER — Telehealth: Payer: Self-pay | Admitting: Physician Assistant

## 2022-09-16 ENCOUNTER — Ambulatory Visit (INDEPENDENT_AMBULATORY_CARE_PROVIDER_SITE_OTHER): Payer: BC Managed Care – PPO | Admitting: Physician Assistant

## 2022-09-16 ENCOUNTER — Other Ambulatory Visit: Payer: Self-pay

## 2022-09-16 VITALS — BP 120/85 | HR 96 | Temp 97.7°F | Resp 16 | Ht 64.0 in | Wt 215.0 lb

## 2022-09-16 DIAGNOSIS — G4710 Hypersomnia, unspecified: Secondary | ICD-10-CM | POA: Diagnosis not present

## 2022-09-16 DIAGNOSIS — F411 Generalized anxiety disorder: Secondary | ICD-10-CM

## 2022-09-16 DIAGNOSIS — E559 Vitamin D deficiency, unspecified: Secondary | ICD-10-CM

## 2022-09-16 DIAGNOSIS — R002 Palpitations: Secondary | ICD-10-CM

## 2022-09-16 MED ORDER — PROPRANOLOL HCL 10 MG PO TABS
10.0000 mg | ORAL_TABLET | Freq: Two times a day (BID) | ORAL | 1 refills | Status: DC | PRN
Start: 2022-09-16 — End: 2022-11-29

## 2022-09-16 MED ORDER — ERGOCALCIFEROL 1.25 MG (50000 UT) PO CAPS
ORAL_CAPSULE | ORAL | 3 refills | Status: DC
Start: 2022-09-16 — End: 2024-01-04

## 2022-09-16 MED ORDER — TRAZODONE HCL 50 MG PO TABS
50.0000 mg | ORAL_TABLET | Freq: Every day | ORAL | 2 refills | Status: DC
Start: 2022-09-16 — End: 2023-01-02

## 2022-09-16 NOTE — Telephone Encounter (Signed)
Awaiting 09/16/22 office notes for SS order-Toni

## 2022-09-16 NOTE — Progress Notes (Signed)
[facility] [address] [place], [place] [address]  Internal MEDICINE  Office Visit Note  Patient Name: Nina Wright  [id number]  [id number]  Date of Service: 09/19/2022  Chief Complaint  Patient presents with   Follow-up   Anxiety    Wants to switch medication at night, causes excess drowsiness.    HPI Pt is here for routine follow up -Has not had labs done due to balance  -hydroxyzine is helping her sleep but lingering grogginess the next morning -would like to try alternative -feeling some heart palpitations more so, has gained some weight and wonders if this is contributing. Always occurs with anxiety and stress and finds it happens at night once she starts ruminating about the day's events. Does not occur during daytime typically. Will give BB to use for anxiety/palpitations prn -Heart monitor still being considered, but also on further discussion main concerns are at night and continues to have symptoms of OSA. Unfortunately in lab testing was not affordable when previously ordered. Will retry HST. She continues to having many year hx of loud snoring, daytime fatigue, gasping, and witnessed apneas.  EPWORTH SLEEPINESS SCALE:  Scale:  (0)= no chance of dozing; (1)= slight chance of dozing; (2)= moderate chance of dozing; (3)= high chance of dozing  Chance  Situtation    Sitting and reading: 2    Watching TV: 3    Sitting Inactive in public: 1    As a passenger in car: 3      Lying down to rest: 3    Sitting and talking: 0    Sitting quielty after lunch: 2    In a car, stopped in traffic: 1   TOTAL SCORE:   15 out of 24   Current Medication: Outpatient Encounter Medications as of 09/16/2022  Medication Sig   hydrOXYzine (VISTARIL) 25 MG capsule Take 1 capsule (25 mg total) by mouth at bedtime.   Multiple Vitamins-Minerals (MULTIVITAMIN WOMEN PO) Take by mouth.   propranolol (INDERAL) 10 MG tablet Take 1 tablet (10 mg total) by mouth 2 (two)  times daily as needed.   traZODone (DESYREL) 50 MG tablet Take 1 tablet (50 mg total) by mouth at bedtime.   [DISCONTINUED] ergocalciferol (DRISDOL) 1.25 MG (50000 UT) capsule Take one cap q week   No facility-administered encounter medications on file as of 09/16/2022.    Surgical History: Past Surgical History:  Procedure Laterality Date   WISDOM TOOTH EXTRACTION     removed only bottom    Medical History: Past Medical History:  Diagnosis Date   Anxiety    Insomnia due to anxiety and fear    Tachycardia    leaky tricuspid valve pt was in high school    Family History: Family History  Problem Relation Age of Onset   Diabetes Mother     Social History   Socioeconomic History   Marital status: Single    Spouse name: Not on file   Number of children: Not on file   Years of education: Not on file   Highest education level: Not on file  Occupational History   Not on file  Tobacco Use   Smoking status: Former    Types: Cigarettes   Smokeless tobacco: Never   Tobacco comments:    age 14  Substance and Sexual Activity   Alcohol use: Yes    Alcohol/week: 2.0 standard drinks of alcohol    Types: 2 Cans of beer per week    Comment: once  a week   Drug use: Never   Sexual activity: Not on file  Other Topics Concern   Not on file  Social History Narrative   Not on file   Social Determinants of Health   Financial Resource Strain: Not on file  Food Insecurity: Not on file  Transportation Needs: Not on file  Physical Activity: Not on file  Stress: Not on file  Social Connections: Not on file  Intimate Partner Violence: Not on file      Review of Systems  Constitutional:  Negative for chills and unexpected weight change.  HENT:  Negative for congestion, postnasal drip, rhinorrhea, sneezing and sore throat.   Eyes:  Negative for redness.  Respiratory:  Positive for apnea. Negative for cough, chest tightness and shortness of breath.   Cardiovascular:  Positive  for palpitations. Negative for chest pain.  Gastrointestinal:  Negative for abdominal pain, constipation, diarrhea, nausea and vomiting.  Genitourinary:  Negative for dysuria and frequency.  Musculoskeletal:  Negative for arthralgias, back pain, joint swelling and neck pain.  Skin:  Negative for rash.  Neurological: Negative.  Negative for tremors and numbness.  Hematological:  Negative for adenopathy. Does not bruise/bleed easily.  Psychiatric/Behavioral:  Positive for sleep disturbance. Negative for self-injury and suicidal ideas. Behavioral problem: Depression.The patient is nervous/anxious.     Vital Signs: BP 120/85   Pulse 96   Temp 97.7 F (36.5 C)   Resp 16   Ht 5\' 4"  (1.626 m)   Wt 215 lb (97.5 kg)   SpO2 96%   BMI 36.90 kg/m    Physical Exam Vitals and nursing note reviewed.  Constitutional:      General: She is not in acute distress.    Appearance: She is well-developed. She is obese. She is not diaphoretic.  HENT:     Head: Normocephalic and atraumatic.     Mouth/Throat:     Pharynx: No oropharyngeal exudate.  Eyes:     Pupils: Pupils are equal, round, and reactive to light.  Neck:     Thyroid: No thyromegaly.     Vascular: No JVD.     Trachea: No tracheal deviation.  Cardiovascular:     Rate and Rhythm: Normal rate and regular rhythm.     Heart sounds: Normal heart sounds. No murmur heard.    No friction rub. No gallop.  Pulmonary:     Effort: Pulmonary effort is normal. No respiratory distress.     Breath sounds: No wheezing or rales.  Chest:     Chest wall: No tenderness.  Breasts:    Right: Normal. No mass.     Left: Normal. No mass.  Abdominal:     General: Bowel sounds are normal.     Palpations: Abdomen is soft.  Musculoskeletal:        General: Normal range of motion.     Cervical back: Normal range of motion and neck supple.  Lymphadenopathy:     Cervical: No cervical adenopathy.  Skin:    General: Skin is warm and dry.  Neurological:      Mental Status: She is alert and oriented to person, place, and time.     Cranial Nerves: No cranial nerve deficit.  Psychiatric:        Behavior: Behavior normal.        Thought Content: Thought content normal.        Judgment: Judgment normal.        Assessment/Plan: 1. Palpitations May use propranolol prn -  propranolol (INDERAL) 10 MG tablet; Take 1 tablet (10 mg total) by mouth 2 (two) times daily as needed.  Dispense: 60 tablet; Refill: 1  2. GAD (generalized anxiety disorder) Will switch to trazodone to help with nighttime anxiety and sleep. Use propranolol prn - traZODone (DESYREL) 50 MG tablet; Take 1 tablet (50 mg total) by mouth at bedtime.  Dispense: 30 tablet; Refill: 2 - propranolol (INDERAL) 10 MG tablet; Take 1 tablet (10 mg total) by mouth 2 (two) times daily as needed.  Dispense: 60 tablet; Refill: 1  3. Hypersomnia Due to snoring, fatigue, gasping, witnessed apneas, and elevated BMI will order HST for further evaluation - Home sleep test   General Counseling: Zillah verbalizes understanding of the findings of todays visit and agrees with plan of treatment. I have discussed any further diagnostic evaluation that may be needed or ordered today. We also reviewed her medications today. she has been encouraged to call the office with any questions or concerns that should arise related to todays visit.    Orders Placed This Encounter  Procedures   Home sleep test    Meds ordered this encounter  Medications   traZODone (DESYREL) 50 MG tablet    Sig: Take 1 tablet (50 mg total) by mouth at bedtime.    Dispense:  30 tablet    Refill:  2   propranolol (INDERAL) 10 MG tablet    Sig: Take 1 tablet (10 mg total) by mouth 2 (two) times daily as needed.    Dispense:  60 tablet    Refill:  1    This patient was seen by Lynn Ito, PA-C in collaboration with Dr. Beverely Risen as a part of collaborative care agreement.   Total time spent:30 Minutes Time  spent includes review of chart, medications, test results, and follow up plan with the patient.      Dr Lyndon Code Internal medicine

## 2022-09-20 ENCOUNTER — Telehealth: Payer: Self-pay | Admitting: Physician Assistant

## 2022-09-20 NOTE — Telephone Encounter (Signed)
 SS order placed in [facility] folder-Toni 

## 2022-09-28 ENCOUNTER — Telehealth: Payer: Self-pay | Admitting: Physician Assistant

## 2022-09-28 NOTE — Telephone Encounter (Signed)
SS order faxed to [facility]; [phone]

## 2022-10-17 ENCOUNTER — Ambulatory Visit: Payer: BC Managed Care – PPO | Admitting: Physician Assistant

## 2022-10-20 ENCOUNTER — Telehealth: Payer: Self-pay | Admitting: Physician Assistant

## 2022-10-20 NOTE — Telephone Encounter (Signed)
Left vm and sent mychart message regarding home SS -Sheralyn Boatman

## 2022-11-28 ENCOUNTER — Encounter: Payer: Self-pay | Admitting: Physician Assistant

## 2022-11-29 ENCOUNTER — Other Ambulatory Visit: Payer: Self-pay

## 2022-11-29 DIAGNOSIS — R002 Palpitations: Secondary | ICD-10-CM

## 2022-11-29 DIAGNOSIS — F411 Generalized anxiety disorder: Secondary | ICD-10-CM

## 2022-11-29 MED ORDER — PROPRANOLOL HCL 10 MG PO TABS
10.0000 mg | ORAL_TABLET | Freq: Two times a day (BID) | ORAL | 1 refills | Status: DC | PRN
Start: 2022-11-29 — End: 2023-01-05

## 2022-12-27 ENCOUNTER — Telehealth: Payer: Self-pay | Admitting: Physician Assistant

## 2022-12-27 NOTE — Telephone Encounter (Signed)
Lmom that she had 1 more refills at her phar

## 2023-01-01 ENCOUNTER — Other Ambulatory Visit: Payer: Self-pay | Admitting: Physician Assistant

## 2023-01-01 DIAGNOSIS — F411 Generalized anxiety disorder: Secondary | ICD-10-CM

## 2023-01-05 ENCOUNTER — Encounter: Payer: Self-pay | Admitting: Physician Assistant

## 2023-01-05 ENCOUNTER — Ambulatory Visit (INDEPENDENT_AMBULATORY_CARE_PROVIDER_SITE_OTHER): Payer: BC Managed Care – PPO | Admitting: Physician Assistant

## 2023-01-05 VITALS — BP 112/80 | HR 49 | Temp 98.3°F | Resp 16 | Ht 64.0 in | Wt 213.0 lb

## 2023-01-05 DIAGNOSIS — R079 Chest pain, unspecified: Secondary | ICD-10-CM

## 2023-01-05 DIAGNOSIS — I071 Rheumatic tricuspid insufficiency: Secondary | ICD-10-CM

## 2023-01-05 DIAGNOSIS — R002 Palpitations: Secondary | ICD-10-CM | POA: Diagnosis not present

## 2023-01-05 DIAGNOSIS — F411 Generalized anxiety disorder: Secondary | ICD-10-CM | POA: Diagnosis not present

## 2023-01-05 MED ORDER — PROPRANOLOL HCL 20 MG PO TABS: 20.0000 mg | ORAL_TABLET | Freq: Two times a day (BID) | ORAL | 2 refills | Status: DC

## 2023-01-05 NOTE — Progress Notes (Signed)
[facility] [address] [place], [place] [address]  Internal MEDICINE  Office Visit Note  Patient Name: Nina Wright  [id number]  [id number]  Date of Service: 01/05/2023  Chief Complaint  Patient presents with   Acute Visit   Medication Management    Wants to increase Propanolol    HPI Pt is here for sick visit -States she had an instance where she had just gotten home and passed out about 3 weeks ago, had CP prior to this. Had not taken propranolol prior to this occurrence. Did not go to ED despite family and friends urging her to. Just went to bed instead. Discussed if this were to happen again or if new or worsening symptoms to go to ED -Has been taking 5 tabs of propranolol at a time about twice per day for heart racing and then will have some chest pain, this improves after propranol, but not always -Did take 4 tabs of propranolol before coming into office today and is likely reason for bradycardia. Not having any symptoms at this time. Will increase propranolol script to 20mg  for this reason to avoid dropping too low. May take 2 tabs if very elevated HR not responding to 20mg  alone. -anxiety not always correlated with palpitations or CP. -Hx of SVT and TR -had previously discussed zio monitor, but was concerned with cost. At this time discussed that a cardiology referral is indicated as symptoms seem to be worsening -Did not call back about sleep study and will follow up on this as we discussed that underlying OSA can cause changes with the heart and may be impacting how she is feeling  Current Medication: Outpatient Encounter Medications as of 01/05/2023  Medication Sig   ergocalciferol (DRISDOL) 1.25 MG (50000 UT) capsule Take one cap q week   hydrOXYzine (VISTARIL) 25 MG capsule Take 1 capsule (25 mg total) by mouth at bedtime.   Multiple Vitamins-Minerals (MULTIVITAMIN WOMEN PO) Take by mouth.   propranolol (INDERAL) 20 MG tablet Take 1 tablet (20 mg  total) by mouth 2 (two) times daily.   traZODone (DESYREL) 50 MG tablet TAKE 1 TABLET BY MOUTH AT BEDTIME   [DISCONTINUED] propranolol (INDERAL) 10 MG tablet Take 1 tablet (10 mg total) by mouth 2 (two) times daily as needed.   No facility-administered encounter medications on file as of 01/05/2023.    Surgical History: Past Surgical History:  Procedure Laterality Date   WISDOM TOOTH EXTRACTION     removed only bottom    Medical History: Past Medical History:  Diagnosis Date   Anxiety    Insomnia due to anxiety and fear    Tachycardia    leaky tricuspid valve pt was in high school    Family History: Family History  Problem Relation Age of Onset   Diabetes Mother     Social History   Socioeconomic History   Marital status: Single    Spouse name: Not on file   Number of children: Not on file   Years of education: Not on file   Highest education level: Not on file  Occupational History   Not on file  Tobacco Use   Smoking status: Former    Types: Cigarettes   Smokeless tobacco: Never   Tobacco comments:    age 89  Substance and Sexual Activity   Alcohol use: Yes    Alcohol/week: 2.0 standard drinks of alcohol    Types: 2 Cans of beer per week    Comment: once a week  Drug use: Never   Sexual activity: Not on file  Other Topics Concern   Not on file  Social History Narrative   Not on file   Social Determinants of Health   Financial Resource Strain: Not on file  Food Insecurity: Not on file  Transportation Needs: Not on file  Physical Activity: Not on file  Stress: Not on file  Social Connections: Not on file  Intimate Partner Violence: Not on file      Review of Systems  Constitutional:  Negative for fatigue and fever.  HENT:  Negative for congestion, mouth sores and postnasal drip.   Respiratory:  Positive for apnea. Negative for cough.   Cardiovascular:  Positive for palpitations. Negative for chest pain.  Genitourinary:  Negative for flank  pain.  Psychiatric/Behavioral:  Positive for sleep disturbance. The patient is nervous/anxious.     Vital Signs: BP 112/80   Pulse (!) 49   Temp 98.3 F (36.8 C)   Resp 16   Ht 5\' 4"  (1.626 m)   Wt 213 lb (96.6 kg)   SpO2 99%   BMI 36.56 kg/m    Physical Exam Vitals and nursing note reviewed.  Constitutional:      General: She is not in acute distress.    Appearance: She is well-developed. She is obese. She is not diaphoretic.  HENT:     Head: Normocephalic and atraumatic.     Mouth/Throat:     Pharynx: No oropharyngeal exudate.  Eyes:     Pupils: Pupils are equal, round, and reactive to light.  Neck:     Thyroid: No thyromegaly.     Vascular: No JVD.     Trachea: No tracheal deviation.  Cardiovascular:     Rate and Rhythm: Regular rhythm. Bradycardia present.     Heart sounds: Normal heart sounds. No murmur heard.    No friction rub. No gallop.  Pulmonary:     Effort: Pulmonary effort is normal. No respiratory distress.  Chest:  Breasts:    Right: Normal. No mass.     Left: Normal. No mass.  Abdominal:     General: Bowel sounds are normal.     Palpations: Abdomen is soft.  Musculoskeletal:        General: Normal range of motion.     Cervical back: Normal range of motion and neck supple.  Lymphadenopathy:     Cervical: No cervical adenopathy.  Skin:    General: Skin is warm and dry.  Neurological:     Mental Status: She is alert and oriented to person, place, and time.     Cranial Nerves: No cranial nerve deficit.  Psychiatric:        Behavior: Behavior normal.        Thought Content: Thought content normal.        Judgment: Judgment normal.        Assessment/Plan: 1. Chest pain, unspecified type No active CP in office today, but has been occurring intermittently and more often. Will refer to cardiology. Advised to go to ED if new or worsening symptoms. - EKG 12-Lead - Ambulatory referral to Cardiology  2. Palpitations Will increase propranolol  to 20mg  and may take 2 if HR does not improve, but want to avoid bradycardia like today. May need to change to alternative BB in future if happening regularly. Will refer to cardiology - Ambulatory referral to Cardiology - propranolol (INDERAL) 20 MG tablet; Take 1 tablet (20 mg total) by mouth 2 (two) times daily.  Dispense: 60 tablet; Refill: 2  3. Tricuspid valve insufficiency, unspecified etiology - Ambulatory referral to Cardiology  4. GAD (generalized anxiety disorder) May continue current medications. Propranolol dose adjusted for palpitations that occur both with and outside of anxiety   General Counseling: Mykah verbalizes understanding of the findings of todays visit and agrees with plan of treatment. I have discussed any further diagnostic evaluation that may be needed or ordered today. We also reviewed her medications today. she has been encouraged to call the office with any questions or concerns that should arise related to todays visit.    Orders Placed This Encounter  Procedures   Ambulatory referral to Cardiology   EKG 12-Lead    Meds ordered this encounter  Medications   propranolol (INDERAL) 20 MG tablet    Sig: Take 1 tablet (20 mg total) by mouth 2 (two) times daily.    Dispense:  60 tablet    Refill:  2    This patient was seen by Lynn Ito, PA-C in collaboration with Dr. Beverely Risen as a part of collaborative care agreement.   Total time spent:35 Minutes Time spent includes review of chart, medications, test results, and follow up plan with the patient.      Dr Lyndon Code Internal medicine

## 2023-01-20 ENCOUNTER — Telehealth: Payer: Self-pay | Admitting: Physician Assistant

## 2023-01-20 NOTE — Telephone Encounter (Signed)
Lvm and sent message regarding SS-Toni

## 2023-01-26 ENCOUNTER — Other Ambulatory Visit: Payer: Self-pay | Admitting: Physician Assistant

## 2023-01-26 DIAGNOSIS — R002 Palpitations: Secondary | ICD-10-CM

## 2023-01-26 DIAGNOSIS — F411 Generalized anxiety disorder: Secondary | ICD-10-CM

## 2023-03-12 ENCOUNTER — Encounter: Payer: Self-pay | Admitting: Physician Assistant

## 2023-03-13 ENCOUNTER — Ambulatory Visit: Payer: BC Managed Care – PPO | Admitting: Physician Assistant

## 2023-05-03 ENCOUNTER — Telehealth: Payer: Self-pay | Admitting: Physician Assistant

## 2023-05-03 NOTE — Telephone Encounter (Signed)
 Lvm & sent mychart msg to move 08/10/23 appointment-Toni

## 2023-08-10 ENCOUNTER — Encounter: Payer: BC Managed Care – PPO | Admitting: Physician Assistant

## 2023-09-21 ENCOUNTER — Encounter: Payer: Self-pay | Admitting: Physician Assistant

## 2023-11-02 ENCOUNTER — Encounter: Payer: Self-pay | Admitting: Physician Assistant

## 2023-11-02 ENCOUNTER — Ambulatory Visit (INDEPENDENT_AMBULATORY_CARE_PROVIDER_SITE_OTHER): Admitting: Physician Assistant

## 2023-11-02 VITALS — BP 128/80 | HR 74 | Temp 98.3°F | Resp 16 | Ht 64.0 in | Wt 226.4 lb

## 2023-11-02 DIAGNOSIS — F411 Generalized anxiety disorder: Secondary | ICD-10-CM | POA: Diagnosis not present

## 2023-11-02 DIAGNOSIS — I071 Rheumatic tricuspid insufficiency: Secondary | ICD-10-CM | POA: Diagnosis not present

## 2023-11-02 DIAGNOSIS — Z0001 Encounter for general adult medical examination with abnormal findings: Secondary | ICD-10-CM | POA: Diagnosis not present

## 2023-11-02 DIAGNOSIS — R002 Palpitations: Secondary | ICD-10-CM

## 2023-11-02 DIAGNOSIS — E669 Obesity, unspecified: Secondary | ICD-10-CM

## 2023-11-02 DIAGNOSIS — F4321 Adjustment disorder with depressed mood: Secondary | ICD-10-CM | POA: Diagnosis not present

## 2023-11-02 NOTE — Progress Notes (Signed)
 [facility] [address] [place], [place] [address]  Internal MEDICINE  Office Visit Note  Patient Name: Nina Wright  [id number]  [id number]  Date of Service: 11/21/2023  Chief Complaint  Patient presents with   Annual Exam   Weight Loss     HPI Pt is here for routine health maintenance examination -Unsure last tdap -did not see cardiology, has also not used propranolol , has not had many palpitations, but does get out of breath -very tired -lost 2 aunts back to back so grieving -states she has option to speak with someone through work, but states she is doing much better now -this put weight loss off track, limits work outs thinking she isnt getting anywhere -has not had sleep study done, wants to start with cardiology and is going to follow up on this -no known hx of thyroid CA and could consider wt loss medication in future pending insurance coverage options and lab results -due for labs  Current Medication: Outpatient Encounter Medications as of 11/02/2023  Medication Sig   ergocalciferol  (DRISDOL ) 1.25 MG (50000 UT) capsule Take one cap q week   hydrOXYzine  (VISTARIL ) 25 MG capsule Take 1 capsule (25 mg total) by mouth at bedtime.   Multiple Vitamins-Minerals (MULTIVITAMIN WOMEN PO) Take by mouth.   propranolol  (INDERAL ) 20 MG tablet Take 1 tablet (20 mg total) by mouth 2 (two) times daily.   traZODone  (DESYREL ) 50 MG tablet TAKE 1 TABLET BY MOUTH AT BEDTIME   No facility-administered encounter medications on file as of 11/02/2023.    Surgical History: Past Surgical History:  Procedure Laterality Date   WISDOM TOOTH EXTRACTION     removed only bottom    Medical History: Past Medical History:  Diagnosis Date   Anxiety    Insomnia due to anxiety and fear    Tachycardia    leaky tricuspid valve pt was in high school    Family History: Family History  Problem Relation Age of Onset   Diabetes Mother       Review of Systems   Constitutional:  Negative for chills and unexpected weight change.  HENT:  Negative for congestion, postnasal drip, rhinorrhea, sneezing and sore throat.   Eyes:  Negative for redness.  Respiratory:  Positive for apnea. Negative for cough, chest tightness and shortness of breath.   Cardiovascular:  Positive for palpitations. Negative for chest pain.  Gastrointestinal:  Negative for abdominal pain, constipation, diarrhea, nausea and vomiting.  Genitourinary:  Negative for dysuria and frequency.  Musculoskeletal:  Negative for arthralgias, back pain, joint swelling and neck pain.  Skin:  Negative for rash.  Neurological: Negative.  Negative for tremors and numbness.  Hematological:  Negative for adenopathy. Does not bruise/bleed easily.  Psychiatric/Behavioral:  Positive for dysphoric mood and sleep disturbance. Negative for self-injury and suicidal ideas. Behavioral problem: Depression.The patient is nervous/anxious.      Vital Signs: BP 128/80 Comment: 142/90  Pulse 74   Temp 98.3 F (36.8 C)   Resp 16   Ht 5' 4 (1.626 m)   Wt 226 lb 6.4 oz (102.7 kg)   SpO2 99%   BMI 38.86 kg/m    Physical Exam Vitals and nursing note reviewed.  Constitutional:      General: She is not in acute distress.    Appearance: She is well-developed. She is obese. She is not diaphoretic.  HENT:     Head: Normocephalic and atraumatic.     Mouth/Throat:     Pharynx: No oropharyngeal  exudate.  Eyes:     Pupils: Pupils are equal, round, and reactive to light.  Neck:     Thyroid: No thyromegaly.     Vascular: No JVD.     Trachea: No tracheal deviation.  Cardiovascular:     Rate and Rhythm: Normal rate and regular rhythm.     Heart sounds: Normal heart sounds. No murmur heard.    No friction rub. No gallop.  Pulmonary:     Effort: Pulmonary effort is normal. No respiratory distress.     Breath sounds: No wheezing or rales.  Chest:     Chest wall: No tenderness.  Breasts:    Right: Normal. No  mass.     Left: Normal. No mass.  Abdominal:     General: Bowel sounds are normal.     Palpations: Abdomen is soft.     Tenderness: There is no abdominal tenderness.  Musculoskeletal:        General: Normal range of motion.     Cervical back: Normal range of motion and neck supple.  Lymphadenopathy:     Cervical: No cervical adenopathy.  Skin:    General: Skin is warm and dry.  Neurological:     Mental Status: She is alert and oriented to person, place, and time.     Cranial Nerves: No cranial nerve deficit.  Psychiatric:        Behavior: Behavior normal.        Thought Content: Thought content normal.        Judgment: Judgment normal.      LABS: No results found for this or any previous visit (from the past 2160 hours).      Assessment/Plan: 1. Encounter for general adult medical examination with abnormal findings (Primary) CPE performed, lab slip given, due for tdap  2. Palpitations Will follow up with cardiology referral and will schedule  3. Tricuspid valve insufficiency, unspecified etiology Will follow up on cardiology referral  4. GAD (generalized anxiety disorder) Managing currently and does have counseling via work if she decides she wants to speak with anyone  5. Grief Improving and reports managing currently, does have counseling via work if she decides she wants to speak with anyone  6. Obesity (BMI 30-39.9) Will check labs and work on diet.   General Counseling: Elke verbalizes understanding of the findings of todays visit and agrees with plan of treatment. I have discussed any further diagnostic evaluation that may be needed or ordered today. We also reviewed her medications today. she has been encouraged to call the office with any questions or concerns that should arise related to todays visit.    Counseling:    No orders of the defined types were placed in this encounter.   No orders of the defined types were placed in this  encounter.   This patient was seen by Tinnie Pro, PA-C in collaboration with Dr. Sigrid Bathe as a part of collaborative care agreement.  Total time spent:35 Minutes  Time spent includes review of chart, medications, test results, and follow up plan with the patient.     Sigrid CHRISTELLA Bathe, MD  Internal Medicine

## 2023-11-30 ENCOUNTER — Ambulatory Visit: Admitting: Physician Assistant

## 2023-12-15 ENCOUNTER — Telehealth: Payer: Self-pay

## 2023-12-15 ENCOUNTER — Other Ambulatory Visit: Payer: Self-pay | Admitting: Physician Assistant

## 2023-12-15 DIAGNOSIS — R7989 Other specified abnormal findings of blood chemistry: Secondary | ICD-10-CM

## 2023-12-15 DIAGNOSIS — R5383 Other fatigue: Secondary | ICD-10-CM

## 2023-12-15 DIAGNOSIS — Z131 Encounter for screening for diabetes mellitus: Secondary | ICD-10-CM

## 2023-12-15 DIAGNOSIS — E538 Deficiency of other specified B group vitamins: Secondary | ICD-10-CM

## 2023-12-15 DIAGNOSIS — E782 Mixed hyperlipidemia: Secondary | ICD-10-CM

## 2023-12-15 DIAGNOSIS — E559 Vitamin D deficiency, unspecified: Secondary | ICD-10-CM

## 2023-12-15 NOTE — Telephone Encounter (Signed)
Left patient a voicemail to give the office a call back.

## 2023-12-18 ENCOUNTER — Ambulatory Visit: Admitting: Physician Assistant

## 2023-12-19 NOTE — Telephone Encounter (Signed)
 Lab orders are in and left patient a messgae to let her know they are in

## 2024-01-04 ENCOUNTER — Encounter: Payer: Self-pay | Admitting: Physician Assistant

## 2024-01-04 ENCOUNTER — Ambulatory Visit (INDEPENDENT_AMBULATORY_CARE_PROVIDER_SITE_OTHER): Admitting: Physician Assistant

## 2024-01-04 VITALS — BP 122/82 | HR 89 | Temp 98.3°F | Resp 16 | Ht 64.0 in | Wt 225.0 lb

## 2024-01-04 DIAGNOSIS — E559 Vitamin D deficiency, unspecified: Secondary | ICD-10-CM | POA: Diagnosis not present

## 2024-01-04 DIAGNOSIS — L42 Pityriasis rosea: Secondary | ICD-10-CM

## 2024-01-04 DIAGNOSIS — N390 Urinary tract infection, site not specified: Secondary | ICD-10-CM

## 2024-01-04 DIAGNOSIS — R319 Hematuria, unspecified: Secondary | ICD-10-CM

## 2024-01-04 DIAGNOSIS — R002 Palpitations: Secondary | ICD-10-CM | POA: Diagnosis not present

## 2024-01-04 DIAGNOSIS — R300 Dysuria: Secondary | ICD-10-CM | POA: Diagnosis not present

## 2024-01-04 LAB — POCT URINALYSIS DIPSTICK
Bilirubin, UA: NEGATIVE
Blood, UA: POSITIVE
Glucose, UA: NEGATIVE
Ketones, UA: POSITIVE
Nitrite, UA: NEGATIVE
Protein, UA: NEGATIVE
Spec Grav, UA: 1.01 (ref 1.010–1.025)
Urobilinogen, UA: 0.2 U/dL
pH, UA: 5 (ref 5.0–8.0)

## 2024-01-04 MED ORDER — PROPRANOLOL HCL 20 MG PO TABS: 20.0000 mg | ORAL_TABLET | Freq: Two times a day (BID) | ORAL | 2 refills | Status: AC

## 2024-01-04 MED ORDER — ERGOCALCIFEROL 1.25 MG (50000 UT) PO CAPS: ORAL_CAPSULE | ORAL | 3 refills | Status: AC

## 2024-01-04 MED ORDER — NITROFURANTOIN MONOHYD MACRO 100 MG PO CAPS: ORAL_CAPSULE | ORAL | 0 refills | Status: DC

## 2024-01-04 NOTE — Progress Notes (Signed)
 [facility] [address] [place], [place] [address]  Internal MEDICINE  Office Visit Note  Patient Name: Nina Wright  [id number]  [id number]  Date of Service: 01/04/2024  Chief Complaint  Patient presents with   Follow-up    HPI Pt is here for routine follow up -Started having some urinary frequency, urgency this morning. No burning. Has had UTI before and feels similar. Had not had one in awhile -cycle ended 1.5 weeks ago and a few days ago a little spotting too that she attributes to stress -dry patches popping up, though it was ringworm initially based on circular appearance. First on arms and between breasts then back and stomach about 1 month ago, 1 on face.  Not itchy or bothersome. 1 on face resolved quicker than others. Still has a few spots, but not as many. States first one was on back of right arm and was bigger than others   -has not contacted cardiology yet, but takes propranolol  as needed -anxiety doing ok and grief improving with time -states she tried to get her labs done but [facility] would not draw them, she will [facility] to [facility]  Current Medication: Outpatient Encounter Medications as of 01/04/2024  Medication Sig   nitrofurantoin, macrocrystal-monohydrate, (MACROBID) 100 MG capsule Take 1 cap twice per day for 10 days.   [DISCONTINUED] ergocalciferol  (DRISDOL ) 1.25 MG (50000 UT) capsule Take one cap q week   [DISCONTINUED] hydrOXYzine  (VISTARIL ) 25 MG capsule Take 1 capsule (25 mg total) by mouth at bedtime.   [DISCONTINUED] Multiple Vitamins-Minerals (MULTIVITAMIN WOMEN PO) Take by mouth.   [DISCONTINUED] propranolol  (INDERAL ) 20 MG tablet Take 1 tablet (20 mg total) by mouth 2 (two) times daily.   [DISCONTINUED] traZODone  (DESYREL ) 50 MG tablet TAKE 1 TABLET BY MOUTH AT BEDTIME   ergocalciferol  (DRISDOL ) 1.25 MG (50000 UT) capsule Take one cap q week   propranolol  (INDERAL ) 20 MG tablet Take 1 tablet (20 mg total) by mouth 2 (two) times daily.    No facility-administered encounter medications on file as of 01/04/2024.    Surgical History: Past Surgical History:  Procedure Laterality Date   WISDOM TOOTH EXTRACTION     removed only bottom    Medical History: Past Medical History:  Diagnosis Date   Anxiety    Insomnia due to anxiety and fear    Tachycardia    leaky tricuspid valve pt was in high school    Family History: Family History  Problem Relation Age of Onset   Diabetes Mother     Social History   Socioeconomic History   Marital status: Single    Spouse name: Not on file   Number of children: Not on file   Years of education: Not on file   Highest education level: Not on file  Occupational History   Not on file  Tobacco Use   Smoking status: Former    Types: Cigarettes   Smokeless tobacco: Never   Tobacco comments:    age 49  Substance and Sexual Activity   Alcohol use: Yes    Alcohol/week: 2.0 standard drinks of alcohol    Types: 2 Cans of beer per week    Comment: once a week   Drug use: Never   Sexual activity: Not on file  Other Topics Concern   Not on file  Social History Narrative   Not on file   Social Drivers of Health   Financial Resource Strain: Not on file  Food Insecurity: Not on file  Transportation Needs: Not on file  Physical Activity: Not on file  Stress: Not on file  Social Connections: Not on file  Intimate Partner Violence: Not on file      Review of Systems  Constitutional:  Negative for chills and unexpected weight change.  HENT:  Negative for congestion, postnasal drip, rhinorrhea, sneezing and sore throat.   Eyes:  Negative for redness.  Respiratory:  Negative for cough, chest tightness and shortness of breath.   Cardiovascular:  Positive for palpitations. Negative for chest pain.  Gastrointestinal:  Negative for abdominal pain, constipation, diarrhea, nausea and vomiting.  Genitourinary:  Positive for dysuria, frequency and urgency.  Musculoskeletal:   Negative for arthralgias, back pain, joint swelling and neck pain.  Skin:  Positive for rash.  Neurological: Negative.  Negative for tremors and numbness.  Hematological:  Negative for adenopathy. Does not bruise/bleed easily.  Psychiatric/Behavioral:  Negative for self-injury and suicidal ideas. Behavioral problem: Depression.    Vital Signs: BP 122/82   Pulse 89   Temp 98.3 F (36.8 C)   Resp 16   Ht 5' 4 (1.626 m)   Wt 225 lb (102.1 kg)   SpO2 100%   BMI 38.62 kg/m    Physical Exam Vitals and nursing note reviewed.  Constitutional:      General: She is not in acute distress.    Appearance: She is well-developed. She is obese. She is not diaphoretic.  HENT:     Head: Normocephalic and atraumatic.  Eyes:     Extraocular Movements: Extraocular movements intact.  Neck:     Thyroid: No thyromegaly.     Vascular: No JVD.     Trachea: No tracheal deviation.  Cardiovascular:     Rate and Rhythm: Normal rate and regular rhythm.     Heart sounds: Normal heart sounds. No murmur heard.    No friction rub. No gallop.  Pulmonary:     Effort: Pulmonary effort is normal. No respiratory distress.     Breath sounds: No wheezing or rales.  Chest:     Chest wall: No tenderness.  Breasts:    Right: Normal. No mass.     Left: Normal. No mass.  Musculoskeletal:        General: Normal range of motion.     Cervical back: Normal range of motion.  Skin:    General: Skin is warm and dry.     Findings: Rash present.     Comments: Sporadic oval shaped rash with raised scaly border throughout body--right leg, between breasts and along trunk  Neurological:     Mental Status: She is alert and oriented to person, place, and time.     Cranial Nerves: No cranial nerve deficit.  Psychiatric:        Behavior: Behavior normal.        Thought Content: Thought content normal.        Judgment: Judgment normal.        Assessment/Plan: 1. Pityriasis rosea (Primary) Rash consistent with  resolving pityriasis rosea and explained this should continue to resolve on its own. If not improving or if new or worsening symptoms then may consider dermatology referral  2. Urinary tract infection with hematuria, site unspecified Will start macrobid BID and adjust based on C/S - CULTURE, URINE COMPREHENSIVE - nitrofurantoin, macrocrystal-monohydrate, (MACROBID) 100 MG capsule; Take 1 cap twice per day for 10 days.  Dispense: 20 capsule; Refill: 0  3. Palpitations Will be looking into cardiology evaluation - propranolol  (INDERAL ) 20  MG tablet; Take 1 tablet (20 mg total) by mouth 2 (two) times daily.  Dispense: 60 tablet; Refill: 2  4. Vitamin D  deficiency - ergocalciferol  (DRISDOL ) 1.25 MG (50000 UT) capsule; Take one cap q week  Dispense: 12 capsule; Refill: 3  5. Dysuria - POCT Urinalysis Dipstick   General Counseling: Serrita verbalizes understanding of the findings of todays visit and agrees with plan of treatment. I have discussed any further diagnostic evaluation that may be needed or ordered today. We also reviewed her medications today. she has been encouraged to call the office with any questions or concerns that should arise related to todays visit.    Orders Placed This Encounter  Procedures   CULTURE, URINE COMPREHENSIVE   POCT Urinalysis Dipstick    Meds ordered this encounter  Medications   propranolol  (INDERAL ) 20 MG tablet    Sig: Take 1 tablet (20 mg total) by mouth 2 (two) times daily.    Dispense:  60 tablet    Refill:  2   ergocalciferol  (DRISDOL ) 1.25 MG (50000 UT) capsule    Sig: Take one cap q week    Dispense:  12 capsule    Refill:  3   nitrofurantoin, macrocrystal-monohydrate, (MACROBID) 100 MG capsule    Sig: Take 1 cap twice per day for 10 days.    Dispense:  20 capsule    Refill:  0    This patient was seen by Tinnie Pro, PA-C in collaboration with Dr. Sigrid Bathe as a part of collaborative care agreement.   Total time spent:30  Minutes Time spent includes review of chart, medications, test results, and follow up plan with the patient.      Dr Fozia M Khan Internal medicine

## 2024-01-06 LAB — CULTURE, URINE COMPREHENSIVE

## 2024-01-08 ENCOUNTER — Ambulatory Visit: Payer: Self-pay | Admitting: Physician Assistant

## 2024-01-08 ENCOUNTER — Telehealth: Payer: Self-pay

## 2024-01-08 NOTE — Telephone Encounter (Signed)
 Spoke with patient regarding urine culture, pt states Macrobid is helping.

## 2024-01-08 NOTE — Telephone Encounter (Signed)
 See other note

## 2024-01-08 NOTE — Telephone Encounter (Signed)
-----   Message from Tinnie MARLA Pro sent at 01/08/2024  1:46 PM EST ----- Urine cultured showed group B strep which often responds to macrobid  (which she is already taking). If no improvement though can switch to ampicillin ----- Message ----- From: Almer Bi, CMA Sent: 01/04/2024  11:45 AM EST To: Tinnie MARLA Pro, PA-C

## 2024-01-09 ENCOUNTER — Other Ambulatory Visit
Admission: RE | Admit: 2024-01-09 | Discharge: 2024-01-09 | Disposition: A | Discharge status: Home / Self Care | Attending: Physician Assistant | Admitting: Physician Assistant

## 2024-01-09 DIAGNOSIS — E782 Mixed hyperlipidemia: Secondary | ICD-10-CM | POA: Diagnosis not present

## 2024-01-09 DIAGNOSIS — R5383 Other fatigue: Secondary | ICD-10-CM | POA: Diagnosis not present

## 2024-01-09 DIAGNOSIS — E559 Vitamin D deficiency, unspecified: Secondary | ICD-10-CM | POA: Insufficient documentation

## 2024-01-09 DIAGNOSIS — E538 Deficiency of other specified B group vitamins: Secondary | ICD-10-CM | POA: Diagnosis not present

## 2024-01-09 DIAGNOSIS — R7989 Other specified abnormal findings of blood chemistry: Secondary | ICD-10-CM | POA: Diagnosis not present

## 2024-01-09 DIAGNOSIS — Z131 Encounter for screening for diabetes mellitus: Secondary | ICD-10-CM | POA: Diagnosis not present

## 2024-01-09 LAB — COMPREHENSIVE METABOLIC PANEL WITH GFR
ALT: 15 U/L (ref 0–44)
AST: 20 U/L (ref 15–41)
Albumin: 4.1 g/dL (ref 3.5–5.0)
Alkaline Phosphatase: 55 U/L (ref 38–126)
Anion gap: 10 (ref 5–15)
BUN: 15 mg/dL (ref 6–20)
CO2: 23 mmol/L (ref 22–32)
Calcium: 8.5 mg/dL — ABNORMAL LOW (ref 8.9–10.3)
Chloride: 103 mmol/L (ref 98–111)
Creatinine, Ser: 0.7 mg/dL (ref 0.44–1.00)
GFR, Estimated: >60 mL/min (ref >60)
Glucose, Bld: 91 mg/dL (ref 70–99)
Potassium: 4 mmol/L (ref 3.5–5.1)
Sodium: 136 mmol/L (ref 135–145)
Total Bilirubin: 0.5 mg/dL (ref 0.0–1.2)
Total Protein: 7.9 g/dL (ref 6.5–8.1)

## 2024-01-09 LAB — LIPID PANEL
Cholesterol: 219 mg/dL — ABNORMAL HIGH (ref 0–200)
HDL: 62 mg/dL (ref >40)
LDL Cholesterol: 139 mg/dL — ABNORMAL HIGH (ref 0–99)
Total CHOL/HDL Ratio: 3.5 ratio
Triglycerides: 88 mg/dL (ref <150)
VLDL: 18 mg/dL (ref 0–40)

## 2024-01-09 LAB — CBC WITH DIFFERENTIAL/PLATELET
Abs Immature Granulocytes: 0.02 K/uL (ref 0.00–0.07)
Basophils Absolute: 0 K/uL (ref 0.0–0.1)
Basophils Relative: 0 %
Eosinophils Absolute: 0.1 K/uL (ref 0.0–0.5)
Eosinophils Relative: 1 %
HCT: 33.9 % — ABNORMAL LOW (ref 36.0–46.0)
Hemoglobin: 10.8 g/dL — ABNORMAL LOW (ref 12.0–15.0)
Immature Granulocytes: 0 %
Lymphocytes Relative: 37 %
Lymphs Abs: 2.1 K/uL (ref 0.7–4.0)
MCH: 28.1 pg (ref 26.0–34.0)
MCHC: 31.9 g/dL (ref 30.0–36.0)
MCV: 88.1 fL (ref 80.0–100.0)
Monocytes Absolute: 0.3 K/uL (ref 0.1–1.0)
Monocytes Relative: 5 %
Neutro Abs: 3.1 K/uL (ref 1.7–7.7)
Neutrophils Relative %: 57 %
Platelets: 311 K/uL (ref 150–400)
RBC: 3.85 MIL/uL — ABNORMAL LOW (ref 3.87–5.11)
RDW: 14.1 % (ref 11.5–15.5)
WBC: 5.6 K/uL (ref 4.0–10.5)
nRBC: 0 % (ref 0.0–0.2)

## 2024-01-09 LAB — VITAMIN B12: Vitamin B-12: 216 pg/mL (ref 180–914)

## 2024-01-09 LAB — HEMOGLOBIN A1C
Hgb A1c MFr Bld: 5.2 % (ref 4.8–5.6)
Mean Plasma Glucose: 102.54 mg/dL

## 2024-01-09 LAB — IRON AND TIBC
Iron: 50 ug/dL (ref 28–170)
Saturation Ratios: 11 % (ref 10.4–31.8)
TIBC: 455 ug/dL — ABNORMAL HIGH (ref 250–450)
UIBC: 405 ug/dL

## 2024-01-09 LAB — VITAMIN D 25 HYDROXY (VIT D DEFICIENCY, FRACTURES): Vit D, 25-Hydroxy: 27.17 ng/mL — ABNORMAL LOW (ref 30–100)

## 2024-01-09 LAB — TSH: TSH: 1.901 u[IU]/mL (ref 0.350–4.500)

## 2024-01-09 LAB — FOLATE: Folate: 10 ng/mL (ref >5.9)

## 2024-01-09 LAB — T4, FREE: Free T4: 0.68 ng/dL (ref 0.61–1.12)

## 2024-01-09 LAB — FERRITIN: Ferritin: 7 ng/mL — ABNORMAL LOW (ref 11–307)

## 2024-01-10 LAB — THYROID ANTIBODIES (THYROPEROXIDASE & THYROGLOBULIN)
Thyroglobulin Antibody: 17 [IU]/mL — ABNORMAL HIGH (ref 0.0–0.9)
Thyroperoxidase Ab SerPl-aCnc: 333 [IU]/mL — ABNORMAL HIGH (ref 0–34)

## 2024-01-10 LAB — T3, FREE: T3, Free: 3.3 pg/mL (ref 2.0–4.4)

## 2024-01-18 ENCOUNTER — Ambulatory Visit: Admitting: Physician Assistant

## 2024-01-21 ENCOUNTER — Ambulatory Visit: Payer: Self-pay | Admitting: Internal Medicine

## 2024-01-22 ENCOUNTER — Telehealth: Payer: Self-pay | Admitting: Internal Medicine

## 2024-01-22 NOTE — Telephone Encounter (Signed)
 Lvm w/ patient to schedule follow up with dfk to review labs-Toni

## 2024-01-26 ENCOUNTER — Telehealth: Payer: Self-pay | Admitting: Internal Medicine

## 2024-01-26 NOTE — Telephone Encounter (Signed)
 Left 2nd vm and sent message to patient to schedule appointment with dfk-Toni

## 2024-02-06 ENCOUNTER — Telehealth: Payer: Self-pay | Admitting: Physician Assistant

## 2024-02-06 ENCOUNTER — Ambulatory Visit: Admitting: Internal Medicine

## 2024-02-06 ENCOUNTER — Encounter: Payer: Self-pay | Admitting: Internal Medicine

## 2024-02-06 VITALS — BP 110/85 | HR 77 | Temp 98.0°F | Resp 16 | Ht 64.0 in | Wt 222.8 lb

## 2024-02-06 DIAGNOSIS — E538 Deficiency of other specified B group vitamins: Secondary | ICD-10-CM

## 2024-02-06 DIAGNOSIS — Z6838 Body mass index (BMI) 38.0-38.9, adult: Secondary | ICD-10-CM

## 2024-02-06 DIAGNOSIS — R5383 Other fatigue: Secondary | ICD-10-CM | POA: Diagnosis not present

## 2024-02-06 DIAGNOSIS — D500 Iron deficiency anemia secondary to blood loss (chronic): Secondary | ICD-10-CM

## 2024-02-06 DIAGNOSIS — R7989 Other specified abnormal findings of blood chemistry: Secondary | ICD-10-CM | POA: Diagnosis not present

## 2024-02-06 DIAGNOSIS — N920 Excessive and frequent menstruation with regular cycle: Secondary | ICD-10-CM | POA: Diagnosis not present

## 2024-02-06 NOTE — Progress Notes (Unsigned)
 [facility] [address] [place], [place] [address]  Internal MEDICINE  Office Visit Note  Patient Name: Nina Wright  [id number]  [id number]  Date of Service: 02/08/2024  Chief Complaint  Patient presents with   Follow-up    Review labs    HPI Pt is seen for follow uo, multiple abnormal labs  Gives h/o excessive fatigue, with heavy menstrual cycle, hg and ferratin level is low  Abnormal thyroid  levels with, both free T4 and TSH on lower side, denies any lump in her neck, she does have elevated TPO and thyroglobulin ab B12 level is low as well     Current Medication: Outpatient Encounter Medications as of 02/06/2024  Medication Sig   ergocalciferol  (DRISDOL ) 1.25 MG (50000 UT) capsule Take one cap q week   propranolol  (INDERAL ) 20 MG tablet Take 1 tablet (20 mg total) by mouth 2 (two) times daily.   [DISCONTINUED] nitrofurantoin , macrocrystal-monohydrate, (MACROBID ) 100 MG capsule Take 1 cap twice per day for 10 days.   No facility-administered encounter medications on file as of 02/06/2024.    Surgical History: Past Surgical History:  Procedure Laterality Date   WISDOM TOOTH EXTRACTION     removed only bottom    Medical History: Past Medical History:  Diagnosis Date   Anxiety    Insomnia due to anxiety and fear    Tachycardia    leaky tricuspid valve pt was in high school    Family History: Family History  Problem Relation Age of Onset   Diabetes Mother     Social History   Socioeconomic History   Marital status: Single    Spouse name: Not on file   Number of children: Not on file   Years of education: Not on file   Highest education level: Not on file  Occupational History   Not on file  Tobacco Use   Smoking status: Former    Types: Cigarettes   Smokeless tobacco: Never   Tobacco comments:    age 86  Substance and Sexual Activity   Alcohol use: Yes    Alcohol/week: 2.0 standard drinks of alcohol    Types: 2 Cans of beer per  week    Comment: once a week   Drug use: Never   Sexual activity: Not on file  Other Topics Concern   Not on file  Social History Narrative   Not on file   Social Drivers of Health   Financial Resource Strain: Not on file  Food Insecurity: Not on file  Transportation Needs: Not on file  Physical Activity: Not on file  Stress: Not on file  Social Connections: Not on file  Intimate Partner Violence: Not on file      Review of Systems  Constitutional:  Positive for fatigue. Negative for fever.  HENT:  Negative for congestion, mouth sores and postnasal drip.   Respiratory:  Negative for cough.   Cardiovascular:  Negative for chest pain.  Genitourinary:  Negative for flank pain.  Psychiatric/Behavioral: Negative.      Vital Signs: BP 110/85   Pulse 77   Temp 98 F (36.7 C)   Resp 16   Ht 5' 4 (1.626 m)   Wt 222 lb 12.8 oz (101.1 kg)   SpO2 100%   BMI 38.24 kg/m    Physical Exam Constitutional:      Appearance: Normal appearance.  HENT:     Head: Normocephalic and atraumatic.     Nose: Nose normal.  Mouth/Throat:     Mouth: Mucous membranes are moist.     Pharynx: No posterior oropharyngeal erythema.  Eyes:     Extraocular Movements: Extraocular movements intact.     Pupils: Pupils are equal, round, and reactive to light.  Cardiovascular:     Pulses: Normal pulses.     Heart sounds: Normal heart sounds.  Pulmonary:     Effort: Pulmonary effort is normal.     Breath sounds: Normal breath sounds.  Neurological:     General: No focal deficit present.     Mental Status: She is alert.  Psychiatric:        Mood and Affect: Mood normal.        Behavior: Behavior normal.        Assessment/Plan: 1. Low TSH level (Primary) Thyroid  u/s before treating to look for any nodule/cyts  - US  THYROID ; Future  2. Other fatigue Multifactorial, IAD and hypothyroidism  - US  THYROID ; Future  3. Menorrhagia with regular cycle - US  Pelvic Complete With  Transvaginal; Future, might need to see gyne  4. Iron deficiency anemia due to chronic blood loss Start iron supplement OTC for now, might need to see GI for EGD and colonoscopy   5. B12 deficiency OTC supplement, will need IM therapy   6. BMI 38.0-38.9,adult Will monitor for now, can be metabolic    General Counseling: Angline verbalizes understanding of the findings of todays visit and agrees with plan of treatment. I have discussed any further diagnostic evaluation that may be needed or ordered today. We also reviewed her medications today. she has been encouraged to call the office with any questions or concerns that should arise related to todays visit.    Orders Placed This Encounter  Procedures   US  THYROID    US  Pelvic Complete With Transvaginal      Total time spent:35 Minutes Time spent includes review of chart, medications, test results, and follow up plan with the patient.   [place] Controlled Substance Database was reviewed by me.   Dr Lucienne Sawyers M Fedor Kazmierski Internal medicine

## 2024-02-06 NOTE — Telephone Encounter (Signed)
Notified patient of U/S appointment date, arrival time, location and to arrive with full bladder-Toni

## 2024-02-14 ENCOUNTER — Other Ambulatory Visit: Payer: Self-pay | Admitting: Internal Medicine

## 2024-02-14 ENCOUNTER — Ambulatory Visit: Admission: RE | Admit: 2024-02-14 | Discharge: 2024-02-14 | Attending: Internal Medicine | Admitting: Internal Medicine

## 2024-02-14 ENCOUNTER — Ambulatory Visit

## 2024-02-14 DIAGNOSIS — Z6838 Body mass index (BMI) 38.0-38.9, adult: Secondary | ICD-10-CM

## 2024-02-14 DIAGNOSIS — N920 Excessive and frequent menstruation with regular cycle: Secondary | ICD-10-CM | POA: Diagnosis present

## 2024-02-14 DIAGNOSIS — R7989 Other specified abnormal findings of blood chemistry: Secondary | ICD-10-CM

## 2024-02-14 DIAGNOSIS — R5383 Other fatigue: Secondary | ICD-10-CM | POA: Diagnosis present

## 2024-02-14 DIAGNOSIS — D500 Iron deficiency anemia secondary to blood loss (chronic): Secondary | ICD-10-CM

## 2024-02-14 DIAGNOSIS — E538 Deficiency of other specified B group vitamins: Secondary | ICD-10-CM

## 2024-02-19 ENCOUNTER — Ambulatory Visit: Payer: Self-pay | Admitting: Internal Medicine

## 2024-02-26 ENCOUNTER — Ambulatory Visit: Admitting: Internal Medicine

## 2024-11-04 ENCOUNTER — Encounter: Admitting: Physician Assistant
